# Patient Record
Sex: Male | Born: 1945 | Race: White | Hispanic: No | Marital: Married | Smoking: Never smoker
Health system: Southern US, Community
[De-identification: ages and names within clinical notes are randomized; demographics above are authoritative.]

## PROBLEM LIST (undated history)

## (undated) DIAGNOSIS — R011 Cardiac murmur, unspecified: Secondary | ICD-10-CM

## (undated) DIAGNOSIS — J189 Pneumonia, unspecified organism: Secondary | ICD-10-CM

## (undated) DIAGNOSIS — J849 Interstitial pulmonary disease, unspecified: Secondary | ICD-10-CM

## (undated) DIAGNOSIS — N400 Enlarged prostate without lower urinary tract symptoms: Secondary | ICD-10-CM

## (undated) DIAGNOSIS — M1990 Unspecified osteoarthritis, unspecified site: Secondary | ICD-10-CM

## (undated) DIAGNOSIS — N3010 Interstitial cystitis (chronic) without hematuria: Secondary | ICD-10-CM

## (undated) DIAGNOSIS — D649 Anemia, unspecified: Secondary | ICD-10-CM

## (undated) HISTORY — DX: Interstitial cystitis (chronic) without hematuria: N30.10

## (undated) HISTORY — PX: HERNIA REPAIR: SHX51

---

## 2008-06-26 ENCOUNTER — Ambulatory Visit (HOSPITAL_COMMUNITY): Admission: RE | Admit: 2008-06-26 | Discharge: 2008-06-26 | Payer: Self-pay | Admitting: General Surgery

## 2008-06-29 ENCOUNTER — Encounter: Admission: RE | Admit: 2008-06-29 | Discharge: 2008-06-29 | Payer: Self-pay | Admitting: General Surgery

## 2008-10-02 ENCOUNTER — Ambulatory Visit (HOSPITAL_BASED_OUTPATIENT_CLINIC_OR_DEPARTMENT_OTHER): Admission: RE | Admit: 2008-10-02 | Discharge: 2008-10-02 | Payer: Self-pay | Admitting: Urology

## 2008-10-21 ENCOUNTER — Emergency Department (HOSPITAL_COMMUNITY): Admission: EM | Admit: 2008-10-21 | Discharge: 2008-10-21 | Payer: Self-pay | Admitting: Emergency Medicine

## 2009-01-17 ENCOUNTER — Encounter: Admission: RE | Admit: 2009-01-17 | Discharge: 2009-01-17 | Payer: Self-pay | Admitting: Gastroenterology

## 2010-09-30 LAB — DIFFERENTIAL
Basophils Absolute: 0 10*3/uL (ref 0.0–0.1)
Lymphocytes Relative: 20 % (ref 12–46)
Neutro Abs: 3.6 10*3/uL (ref 1.7–7.7)
Neutrophils Relative %: 71 % (ref 43–77)

## 2010-09-30 LAB — URINALYSIS, ROUTINE W REFLEX MICROSCOPIC
Bilirubin Urine: NEGATIVE
Glucose, UA: NEGATIVE mg/dL
Hgb urine dipstick: NEGATIVE
Protein, ur: NEGATIVE mg/dL

## 2010-09-30 LAB — CBC
HCT: 38.6 % — ABNORMAL LOW (ref 39.0–52.0)
MCV: 88.7 fL (ref 78.0–100.0)
Platelets: 157 10*3/uL (ref 150–400)
RBC: 4.35 MIL/uL (ref 4.22–5.81)
WBC: 5.1 10*3/uL (ref 4.0–10.5)

## 2010-09-30 LAB — COMPREHENSIVE METABOLIC PANEL
BUN: 22 mg/dL (ref 6–23)
CO2: 32 mEq/L (ref 19–32)
Chloride: 100 mEq/L (ref 96–112)
Creatinine, Ser: 1.46 mg/dL (ref 0.4–1.5)
GFR calc non Af Amer: 49 mL/min — ABNORMAL LOW (ref >60)
Total Bilirubin: 0.9 mg/dL (ref 0.3–1.2)

## 2010-10-29 NOTE — Op Note (Signed)
NAMEJOLLY, Javier Phillips            ACCOUNT NO.:  [id number]   MEDICAL RECORD NO.:  [id number]          PATIENT TYPE:  AMB   LOCATION:  DAY                          FACILITY:  [facility]   PHYSICIAN:  Angelia Mould. Derrell Lolling, M.D.DATE OF BIRTH:  Nov 05, 1945   DATE OF PROCEDURE:  06/26/2008  DATE OF DISCHARGE:                               OPERATIVE REPORT   PREOPERATIVE DIAGNOSIS:  Right inguinal hernia.   POSTOPERATIVE DIAGNOSIS:  Right inguinal hernia.   OPERATION PERFORMED:  Laparoscopic, totally extraperitoneal, repair  right inguinal  hernia with 3DMax polypropylene mesh.   SURGEON:  Angelia Mould. Derrell Lolling, M.D.   OPERATIVE INDICATIONS:  This is a 65 year old white man who had a left  inguinal hernia repair several years ago and did well from that.  He  presents now with a 2-year history of a bulge in his right groin.  This  is causing some discomfort and has been getting larger.  On exam he has  a moderate size right inguinal hernia which is reducible.  There is no  evidence of hernia on the left side.  He was counseled as an outpatient.  He is brought to the operating room electively.   OPERATIVE FINDINGS:  The patient had an indirect right inguinal hernia.  I did not see any evidence of femoral hernia or direct hernia.  I was  able to completely reduce the indirect sac well back above the level of  the anterior superior iliac spine.   OPERATIVE TECHNIQUE:  Following the induction of general endotracheal  anesthesia, Foley catheter was inserted.  The bladder was emptied.  The  Foley balloon was deflated and the catheter taped to his leg.  The  abdomen and genitalia were prepped and draped in a sterile fashion.  Intravenous antibiotics were given.  A surgical time-out and surgical  checklist were held, identifying correct patient, correct procedure, and  correct site.  Half percent Marcaine with epinephrine was used as a  local infiltration anesthetic.  A curved transverse incision was  made at  the lower rim of the umbilicus.  The fascia was incised transversely,  exposing the medial border of the right rectus muscle.  A dissector  balloon was inserted behind the right rectus muscle and the right rectus  sheath down to just above the level of the symphysis pubis.  A video  camera was inserted.  The dissector balloon was inflated with air  manually under direct vision and held in place for about 3 minutes.  Visualization was good, demonstrating the rectus muscles anteriorly,  preperitoneal space posteriorly, symphysis pubis and Cooper ligaments on  both sides inferiorly, and we could see the inferior epigastric vessels  on both sides.  After removing the dissector balloon, we secured the  trocar and connected it to the insufflator at 12 mmHg.  Visualization  was good and bleeding was minimal.  We placed a 5-mm trocar in the  midline below the umbilicus and used that to clean off the peritoneum in  the left lower quadrant and then we placed a 5-mm trocar in the left  lower quadrant.  We had a  pneumoperitoneum and so we placed a Veress  needle in the right upper quadrant to evacuate the pneumoperitoneum.  That was done without any difficulty.  We dissected the peritoneum off  of the lateral abdominal wall on the right side and then carried the  dissection back to the cord structures.  We identified the inferior  epigastric vessels and the cord structures.  We identified pulsations of  the iliac vessels.  We spent some time dissecting the indirect hernia  sac and the peritoneum down off of the cord structures.  We identified  the testicular vessels and the vas deferens.  We stripped the peritoneum  far back above the level of the anterior superior iliac spine.  I did  not see any other hernias.  A Bard 3DMax mesh, large size, was inserted  and positioned in the usual fashion.  This overlapped the midline  slightly, overlapped Cooper ligaments slightly and then deployed   laterally, lateral to the cord structures quite nicely.  A 5-mm screw  tacker was placed.  About 4 screw tacks were placed along the superior  rim of the Cooper ligament and symphysis pubis.  Tacks were placed up  the midline and across the posterior belly of the rectus muscle.  Laterally I was careful to palpate the tacker through the abdominal wall  to be sure that all of the fixation tacks were placed above the  iliopubic tract.  The mesh fixation was inspected and looked very good,  covering all areas intended.  There was no bleeding.  The trocars were  removed.  The pneumoperitoneum was released.  The Veress needle was  removed.  The fascia at the umbilicus was closed with 2 figure-of-eight  sutures of 0 Vicryl.  Skin incisions were closed with subcuticular  sutures of 4-0 Monocryl and Dermabond.  Clean bandages were placed and  the patient taken to the recovery room in stable condition.  Estimated  blood loss was about 10 mL.  Complications, none.  Sponge, needle and  instrument counts were correct.      Angelia Mould. Derrell Lolling, M.D.  Electronically Signed     HMI/MEDQ  D:  06/26/2008  T:  06/26/2008  Job:  [id number]   cc:   Chales Salmon. Abigail Miyamoto, M.D.  Fax: [phone]

## 2010-10-29 NOTE — Consult Note (Signed)
Javier Phillips, Javier Phillips NO.:  [id number]   MEDICAL RECORD NO.:  [id number]          PATIENT TYPE:  AMB   LOCATION:  DAY                          FACILITY:  [facility]   PHYSICIAN:  Courtney Paris, M.D.DATE OF BIRTH:  1946/01/19   DATE OF CONSULTATION:  06/27/2008  DATE OF DISCHARGE:                                 CONSULTATION   REQUESTING PHYSICIAN:  Angelia Mould. Derrell Lolling, M.D.   REASON FOR CONSULTATION:  Urinary retention, postoperative laparoscopic  hernia.   HISTORY OF PRESENT ILLNESS:  This 65 year old patient had a laparoscopic  hernia done today.  He voided a small amount before he left outpatient  but when he got home he could not void.  He came back and was in urinary  retention.  The nurses tried to place a Foley catheter but were unable  to do so.  The  patient has been seeing Dr. Isabel Caprice for a couple of  years.  He has been on Flomax monotherapy.  He has never had prostate  surgery or UTI.  He had not been in retention before.  He did have some  balanitis from a phimosis and was originally set up for a circumcision  but then later cancelled when this got better.  He last saw Dr. Isabel Caprice  about six months ago.   PAST MEDICAL HISTORY/SOCIAL HISTORY/REVIEW OF SYSTEMS:  Are all noted on  the chart and unchanged.   FAMILY HISTORY:  Unremarkable.   PHYSICAL EXAMINATION:  GENERAL:  He is a pleasant white male in some  acute distress due to bladder distention.  He is a thin white male.  VITAL SIGNS:  Stable.  HEENT:  Clear.  NECK:  Supple.  ABDOMEN:  Soft.  Recent scars from recent surgery and the bladder is  distended.  GENITOURINARY:  Penis is uncircumcised with the phimosis.  Bilaterally  distended, somewhat atrophic testes.  Prostate is moderate in size, not  fixed or indurated.  SVs not palpated.  EXTREMITIES:  Negative.  No edema of the distal pulses.   With Xylocaine jelly I was able to insert a #16 Foley catheter without  difficulty and he had  nearly 1000 cc of clear urine obtained.  He had  immediate relief.   IMPRESSION:  1. Urinary retention.  2. Postoperative laparoscopic hernia repair.  3. Chronic obstructive uropathy.  4. Phimosis.   RECOMMENDATIONS:  1. Continue Flomax.  2. Send out with Foley catheter in place with a leg bag and a large      bag for night time use.  3. Call Dr. Ellin Goodie office tomorrow to see about when he wants to      come in for a voiding trial but remove the catheter 6-8 hours      before he comes in to the office for followup.  Dr. Isabel Caprice may want      to put him on some finasteride but for now just continue      monotherapy with Flomax.      Courtney Paris, M.D.  Electronically Signed     HMK/MEDQ  D:  06/26/2008  T:  06/26/2008  Job:  [id number]

## 2010-10-29 NOTE — Op Note (Signed)
Javier Phillips, Javier Phillips            ACCOUNT NO.:  [id number]   MEDICAL RECORD NO.:  [id number]          PATIENT TYPE:  AMB   LOCATION:  [facility]                         FACILITY:  [facility]   PHYSICIAN:  Valetta Fuller, M.D.  DATE OF BIRTH:  12-Aug-1945   DATE OF PROCEDURE:  DATE OF DISCHARGE:                               OPERATIVE REPORT   PREOPERATIVE DIAGNOSES:  1. Phimosis.  2. Recurrent balanitis.   POSTOPERATIVE DIAGNOSES:  1. Phimosis.  2. Recurrent balanitis.   PROCEDURE PERFORMED:  Circumcision.   SURGEON:  Valetta Fuller, M.D.   ANESTHESIA:  General with penile block.   INDICATIONS:  Javier Phillips is 65 years of age.  He has had very  longstanding problems with chronic phimosis and recurrent balanitis.  We  had talked to him several times in the past about the possibility of  circumcision.  He has continued to have intermittent problems and called  recently requesting that we go ahead with circumcision.  The patient  appeared to understand the advantages and disadvantages of this  procedure.  He currently has considerable difficulty retracting his  foreskin but does not have any active balanitis at this time.   TECHNIQUE AND FINDINGS:  The patient was brought to the operating room.  Appropriate patient identification was performed.  The patient had  successful induction of general anesthesia.  He was then prepped and  draped in the usual manner.  Operative time-out was performed.  Penile  block was performed with 10 mL of 4% Marcaine.  A circumferential  incision was made behind the coronal sulcus.  The inner aspect of the  foreskin had to be opened with a hemostat to allow for retraction.  The  penis and glans penis were then reprepped since we were unable to  completely retract the foreskin initially.  Once that was accomplished,  a second circumferential incision was made in the mucosal collar.  The  sleeve or redundant foreskin was removed.  Some frenular bands  were  taken down and the frenulum was then reapproximated along the mucosal  collar with some interrupted 4-0 Vicryl suture.  Skin was then  reapproximated with interrupted 4-0 Vicryl suture.  Bacitracin ointment  was placed around the incision.  A Tegaderm dressing was loosely applied  and the penis was lightly wrapped with Coban.  The patient appeared to  tolerate the procedure well.  There did not appear to be any obvious  complications or problems.      Valetta Fuller, M.D.  Electronically Signed    DSG/MEDQ  D:  10/02/2008  T:  10/02/2008  Job:  [id number]

## 2011-05-23 ENCOUNTER — Inpatient Hospital Stay (HOSPITAL_BASED_OUTPATIENT_CLINIC_OR_DEPARTMENT_OTHER)
Admission: EM | Admit: 2011-05-23 | Discharge: 2011-05-26 | DRG: 194 | Disposition: A | Discharge status: Home / Self Care | Payer: Medicare Other | Attending: Internal Medicine | Admitting: Internal Medicine

## 2011-05-23 ENCOUNTER — Other Ambulatory Visit: Payer: Self-pay

## 2011-05-23 DIAGNOSIS — J189 Pneumonia, unspecified organism: Principal | ICD-10-CM | POA: Diagnosis present

## 2011-05-23 DIAGNOSIS — R0600 Dyspnea, unspecified: Secondary | ICD-10-CM | POA: Diagnosis present

## 2011-05-23 DIAGNOSIS — E876 Hypokalemia: Secondary | ICD-10-CM | POA: Diagnosis present

## 2011-05-23 DIAGNOSIS — R0602 Shortness of breath: Secondary | ICD-10-CM | POA: Diagnosis present

## 2011-05-23 DIAGNOSIS — D649 Anemia, unspecified: Secondary | ICD-10-CM | POA: Diagnosis present

## 2011-05-23 DIAGNOSIS — R5381 Other malaise: Secondary | ICD-10-CM | POA: Diagnosis present

## 2011-05-23 DIAGNOSIS — Z23 Encounter for immunization: Secondary | ICD-10-CM

## 2011-05-23 DIAGNOSIS — E860 Dehydration: Secondary | ICD-10-CM | POA: Diagnosis present

## 2011-05-23 DIAGNOSIS — N179 Acute kidney failure, unspecified: Secondary | ICD-10-CM | POA: Diagnosis present

## 2011-05-23 DIAGNOSIS — N400 Enlarged prostate without lower urinary tract symptoms: Secondary | ICD-10-CM | POA: Diagnosis present

## 2011-05-23 DIAGNOSIS — D696 Thrombocytopenia, unspecified: Secondary | ICD-10-CM | POA: Diagnosis present

## 2011-05-23 DIAGNOSIS — E871 Hypo-osmolality and hyponatremia: Secondary | ICD-10-CM | POA: Diagnosis present

## 2011-05-23 DIAGNOSIS — R509 Fever, unspecified: Secondary | ICD-10-CM | POA: Diagnosis present

## 2011-05-23 HISTORY — DX: Benign prostatic hyperplasia without lower urinary tract symptoms: N40.0

## 2011-05-23 HISTORY — DX: Cardiac murmur, unspecified: R01.1

## 2011-05-23 LAB — DIFFERENTIAL
Basophils Absolute: 0 10*3/uL (ref 0.0–0.1)
Basophils Relative: 0 % (ref 0–1)
Lymphocytes Relative: 6 % — ABNORMAL LOW (ref 12–46)
Monocytes Relative: 10 % (ref 3–12)
Neutro Abs: 6.5 10*3/uL (ref 1.7–7.7)

## 2011-05-23 LAB — URINALYSIS, ROUTINE W REFLEX MICROSCOPIC
Bilirubin Urine: NEGATIVE
Hgb urine dipstick: NEGATIVE
Ketones, ur: 15 mg/dL — AB
Protein, ur: 30 mg/dL — AB
Urobilinogen, UA: 0.2 mg/dL (ref 0.0–1.0)

## 2011-05-23 LAB — CBC
HCT: 37.3 % — ABNORMAL LOW (ref 39.0–52.0)
Hemoglobin: 12.8 g/dL — ABNORMAL LOW (ref 13.0–17.0)
RDW: 14.1 % (ref 11.5–15.5)
WBC: 7.8 10*3/uL (ref 4.0–10.5)

## 2011-05-23 LAB — COMPREHENSIVE METABOLIC PANEL
AST: 20 U/L (ref 0–37)
Alkaline Phosphatase: 55 U/L (ref 39–117)
CO2: 25 mEq/L (ref 19–32)
Chloride: 95 mEq/L — ABNORMAL LOW (ref 96–112)
Creatinine, Ser: 1.3 mg/dL (ref 0.50–1.35)
GFR calc non Af Amer: 56 mL/min — ABNORMAL LOW (ref >90)
Total Bilirubin: 0.7 mg/dL (ref 0.3–1.2)

## 2011-05-23 LAB — URINE MICROSCOPIC-ADD ON

## 2011-05-23 LAB — PROCALCITONIN: Procalcitonin: 0.45 ng/mL

## 2011-05-23 LAB — LACTIC ACID, PLASMA: Lactic Acid, Venous: 1.1 mmol/L (ref 0.5–2.2)

## 2011-05-23 MED ORDER — ACETAMINOPHEN 325 MG PO TABS
650.0000 mg | ORAL_TABLET | Freq: Four times a day (QID) | ORAL | Status: DC | PRN
  Administered 2011-05-24 (×2): 650 mg via ORAL
  Filled 2011-05-23 (×2): qty 2

## 2011-05-23 MED ORDER — DEXTROSE 5 % IV SOLN
1.0000 g | Freq: Once | INTRAVENOUS | Status: AC
  Administered 2011-05-23: 1 g via INTRAVENOUS
  Filled 2011-05-23: qty 10

## 2011-05-23 MED ORDER — OXYCODONE HCL 5 MG PO TABS: 5.0000 mg | ORAL_TABLET | ORAL | Status: DC | PRN

## 2011-05-23 MED ORDER — DEXTROSE 5 % IV SOLN
500.0000 mg | INTRAVENOUS | Status: DC
  Administered 2011-05-24 – 2011-05-25 (×2): 500 mg via INTRAVENOUS
  Filled 2011-05-23 (×3): qty 500

## 2011-05-23 MED ORDER — ONDANSETRON HCL 4 MG PO TABS: 4.0000 mg | ORAL_TABLET | Freq: Four times a day (QID) | ORAL | Status: DC | PRN

## 2011-05-23 MED ORDER — HYDROMORPHONE HCL PF 1 MG/ML IJ SOLN: 0.5000 mg | INTRAMUSCULAR | Status: DC | PRN

## 2011-05-23 MED ORDER — ONDANSETRON HCL 4 MG/2ML IJ SOLN: 4.0000 mg | Freq: Four times a day (QID) | INTRAMUSCULAR | Status: DC | PRN

## 2011-05-23 MED ORDER — ALBUTEROL SULFATE (5 MG/ML) 0.5% IN NEBU
2.5000 mg | INHALATION_SOLUTION | Freq: Four times a day (QID) | RESPIRATORY_TRACT | Status: DC
  Administered 2011-05-24: 2.5 mg via RESPIRATORY_TRACT
  Filled 2011-05-23: qty 0.5

## 2011-05-23 MED ORDER — ZOLPIDEM TARTRATE 5 MG PO TABS: 5.0000 mg | ORAL_TABLET | Freq: Every evening | ORAL | Status: DC | PRN

## 2011-05-23 MED ORDER — ENOXAPARIN SODIUM 40 MG/0.4ML ~~LOC~~ SOLN
40.0000 mg | SUBCUTANEOUS | Status: DC
  Administered 2011-05-24: 40 mg via SUBCUTANEOUS
  Filled 2011-05-23: qty 0.4

## 2011-05-23 MED ORDER — ALUM & MAG HYDROXIDE-SIMETH 200-200-20 MG/5ML PO SUSP: 30.0000 mL | Freq: Four times a day (QID) | ORAL | Status: DC | PRN

## 2011-05-23 MED ORDER — SODIUM CHLORIDE 0.9 % IV SOLN
INTRAVENOUS | Status: DC
  Administered 2011-05-24 – 2011-05-25 (×4): via INTRAVENOUS

## 2011-05-23 MED ORDER — DEXTROSE 5 % IV SOLN
500.0000 mg | Freq: Once | INTRAVENOUS | Status: DC
  Filled 2011-05-23: qty 500

## 2011-05-23 MED ORDER — DEXTROSE 5 % IV SOLN
1.0000 g | INTRAVENOUS | Status: DC
  Administered 2011-05-24 – 2011-05-25 (×2): 1 g via INTRAVENOUS
  Filled 2011-05-23 (×3): qty 10

## 2011-05-23 MED ORDER — ACETAMINOPHEN 650 MG RE SUPP: 650.0000 mg | Freq: Four times a day (QID) | RECTAL | Status: DC | PRN

## 2011-05-23 MED ORDER — ACETAMINOPHEN 650 MG RE SUPP
650.0000 mg | Freq: Once | RECTAL | Status: AC
  Administered 2011-05-23: 650 mg via RECTAL
  Filled 2011-05-23: qty 1

## 2011-05-23 MED ORDER — ENOXAPARIN SODIUM 60 MG/0.6ML ~~LOC~~ SOLN: 1.0000 mg/kg | Freq: Two times a day (BID) | SUBCUTANEOUS | Status: DC

## 2011-05-23 MED ORDER — SODIUM CHLORIDE 0.9 % IV SOLN: INTRAVENOUS | Status: DC

## 2011-05-23 MED ORDER — ACETAMINOPHEN 325 MG PO TABS: 650.0000 mg | ORAL_TABLET | Freq: Four times a day (QID) | ORAL | Status: DC | PRN

## 2011-05-23 MED ORDER — ACETAMINOPHEN 325 MG PO TABS
650.0000 mg | ORAL_TABLET | Freq: Once | ORAL | Status: AC
  Administered 2011-05-23: 650 mg via ORAL
  Filled 2011-05-23: qty 2

## 2011-05-23 MED ORDER — ONDANSETRON HCL 4 MG/2ML IJ SOLN
4.0000 mg | Freq: Once | INTRAMUSCULAR | Status: AC
  Administered 2011-05-23: 4 mg via INTRAVENOUS

## 2011-05-23 MED ORDER — ACETAMINOPHEN 325 MG PO TABS
650.0000 mg | ORAL_TABLET | Freq: Once | ORAL | Status: DC
  Filled 2011-05-23: qty 2

## 2011-05-23 MED ORDER — ONDANSETRON HCL 4 MG/2ML IJ SOLN
INTRAMUSCULAR | Status: AC
  Administered 2011-05-23: 4 mg via INTRAVENOUS
  Filled 2011-05-23: qty 2

## 2011-05-23 NOTE — ED Provider Notes (Signed)
History     CSN: [id number] Arrival date & time: 05/23/2011  1:26 PM   First MD Initiated Contact with Patient 05/23/11 1326      Chief Complaint  Patient presents with  . Sinusitis  . Sore Throat  . Cough  . Dehydration  . Fever    (Consider location/radiation/quality/duration/timing/severity/associated sxs/prior treatment) HPI Comments: Patient is a 65 year old man who says that his illness started like sinusitis several days ago. He had been seen by Juluis Rainier M.D., 2 days ago, and was treated for sinusitis with amoxicillin. He has gotten progressively worse, with high fever, 102.9 this morning, a dry hacking cough, and low blood pressure of approximately 90/50. Dr. Zachery Dauer was concerned that he could be developing pneumonia, and he was sent to [facility] ED for further evaluation and treatment.  Patient is a 65 y.o. male presenting with sinusitis, pharyngitis, cough, and fever. The history is provided by the patient and a caregiver. No language interpreter was used.  Sinusitis  This is a new problem. The current episode started more than 2 days ago. The problem has been gradually worsening. The maximum temperature recorded prior to his arrival was 102 to 102.9 F. The pain is at a severity of 7/10. The pain is moderate. The pain has been constant since onset. Associated symptoms include chills, sweats, congestion, sore throat, cough and shortness of breath. Treatments tried: He was treated with amoxicillin for the past 2 days without benefit.  Sore Throat Associated symptoms include shortness of breath.  Cough Associated symptoms include chills, sweats, sore throat and shortness of breath.  Fever Primary symptoms of the febrile illness include fever, cough and shortness of breath.    Past Medical History  Diagnosis Date  . Enlarged prostate     Past Surgical History  Procedure Date  . Hernia repair     History reviewed. No pertinent family  history.  History  Substance Use Topics  . Smoking status: Never Smoker   . Smokeless tobacco: Never Used  . Alcohol Use: No      Review of Systems  Constitutional: Positive for fever and chills.  HENT: Positive for congestion and sore throat.   Eyes: Negative.   Respiratory: Positive for cough and shortness of breath.   Cardiovascular: Negative.   Gastrointestinal: Negative.   Genitourinary: Negative.   Musculoskeletal: Negative.   Neurological: Negative.   Psychiatric/Behavioral: Negative.     Allergies  Review of patient's allergies indicates no known allergies.  Home Medications   Current Outpatient Rx  Name Route Sig Dispense Refill  . ACETAMINOPHEN 325 MG PO TABS Oral Take 650 mg by mouth every 6 (six) hours as needed. For fever     . AMOXICILLIN 875 MG PO TABS Oral Take 875 mg by mouth 2 (two) times daily.     Marland Kitchen TAMSULOSIN HCL 0.4 MG PO CAPS Oral Take 0.4 mg by mouth daily.       BP 93/61  Pulse 84  Temp(Src) 100 F (37.8 C) (Oral)  Resp 16  Ht 5\' 7"  (1.702 m)  Wt 134 lb (60.782 kg)  BMI 20.99 kg/m2  SpO2 97%  Physical Exam  Nursing note and vitals reviewed. Constitutional: He is oriented to person, place, and time. He appears well-developed.       Patient is a slender elderly man who appears ill.  HENT:  Head: Normocephalic and atraumatic.  Right Ear: External ear normal.  Left Ear: External ear normal.  Mouth/Throat: Oropharynx is clear  and moist.  Eyes: Conjunctivae are normal.  Neck: Normal range of motion. Neck supple.  Cardiovascular: Normal rate and regular rhythm.   Pulmonary/Chest: Effort normal and breath sounds normal. He has no wheezes. He has no rales.  Abdominal: Soft. Bowel sounds are normal. He exhibits no distension. There is no tenderness.  Musculoskeletal: Normal range of motion.  Neurological: He is alert and oriented to person, place, and time.       No sensory or motor deficit.  Skin: Skin is warm and dry.  Psychiatric: He  has a normal mood and affect. His behavior is normal.    ED Course  Procedures (including critical care time)   Labs Reviewed  CBC  DIFFERENTIAL  COMPREHENSIVE METABOLIC PANEL  URINALYSIS, ROUTINE W REFLEX MICROSCOPIC  URINE CULTURE  LACTIC ACID, PLASMA  PROCALCITONIN  CULTURE, BLOOD (ROUTINE X 2)  CULTURE, BLOOD (ROUTINE X 2)   2:11 PM Patient was seen and had physical exam. I contacted Juluis Rainier M.D. and discussed his situation. She is concerned that he could have pneumonia. She also felt he was dehydrated. I called [facility] radiology to have him read the patient's chest x-ray. Laboratory tests and IV fluids were ordered.  2:55 PM  Date: 05/23/2011  Rate:78  Rhythm: normal sinus rhythm  QRS Axis: normal  Intervals: normal  ST/T Wave abnormalities: normal  Conduction Disutrbances:none  Narrative Interpretation: Normal EKG.  Old EKG Reviewed: unchanged  Results for orders placed during the hospital encounter of 05/23/11  CBC      Component Value Range   WBC 7.8  4.0 - 10.5 (K/uL)   RBC 4.42  4.22 - 5.81 (MIL/uL)   Hemoglobin 12.8 (*) 13.0 - 17.0 (g/dL)   HCT 16.1 (*) 09.6 - 52.0 (%)   MCV 84.4  78.0 - 100.0 (fL)   MCH 29.0  26.0 - 34.0 (pg)   MCHC 34.3  30.0 - 36.0 (g/dL)   RDW 04.5  40.9 - 81.1 (%)   Platelets 94 (*) 150 - 400 (K/uL)  DIFFERENTIAL      Component Value Range   Neutrophils Relative 84 (*) 43 - 77 (%)   Lymphocytes Relative 6 (*) 12 - 46 (%)   Monocytes Relative 10  3 - 12 (%)   Eosinophils Relative 0  0 - 5 (%)   Basophils Relative 0  0 - 1 (%)   Neutro Abs 6.5  1.7 - 7.7 (K/uL)   Lymphs Abs 0.5 (*) 0.7 - 4.0 (K/uL)   Monocytes Absolute 0.8  0.1 - 1.0 (K/uL)   Eosinophils Absolute 0.0  0.0 - 0.7 (K/uL)   Basophils Absolute 0.0  0.0 - 0.1 (K/uL)  COMPREHENSIVE METABOLIC PANEL      Component Value Range   Sodium 131 (*) 135 - 145 (mEq/L)   Potassium 3.5  3.5 - 5.1 (mEq/L)   Chloride 95 (*) 96 - 112 (mEq/L)   CO2 25  19 - 32 (mEq/L)    Glucose, Bld 114 (*) 70 - 99 (mg/dL)   BUN 19  6 - 23 (mg/dL)   Creatinine, Ser 9.14  0.50 - 1.35 (mg/dL)   Calcium 8.5  8.4 - 78.2 (mg/dL)   Total Protein 6.9  6.0 - 8.3 (g/dL)   Albumin 3.4 (*) 3.5 - 5.2 (g/dL)   AST 20  0 - 37 (U/L)   ALT 14  0 - 53 (U/L)   Alkaline Phosphatase 55  39 - 117 (U/L)   Total Bilirubin 0.7  0.3 -  1.2 (mg/dL)   GFR calc non Af Amer 56 (*) >90 (mL/min)   GFR calc Af Amer 65 (*) >90 (mL/min)  LACTIC ACID, PLASMA      Component Value Range   Lactic Acid, Venous 1.1  0.5 - 2.2 (mmol/L)   4:07 PM Laboratory tests were reviewed. He has a CBC with white blood count of 7800 with 84% neutrophils. Chemistries showed sodium slightly low at 131 glucose slightly elevated at 114. Lactic acid was normal at 1.1. Patient has been unable to urinate, showing to some extent how dehydrated he has. Chest x-ray done at [facility] showed a right lower lobe pneumonia. I advised the patient and his wife of these findings. We will treat him for community-acquired pneumonia with Rocephin and Zithromax.  4:49 PM Case discussed with Dr. Venetia Constable, who accepts pt in transfer to [facility].   1. Community acquired pneumonia            Carleene Cooper III, MD 05/23/11 [phone]

## 2011-05-23 NOTE — ED Notes (Signed)
Waiting on urine from Pt. And he has been given water to drink for UA

## 2011-05-23 NOTE — ED Notes (Signed)
Pt reports cold symptoms x 5 days, treated with Amoxicillin and fever developed 2 days ago.  Sent to ED by PMD for further evaluation.

## 2011-05-23 NOTE — ED Notes (Signed)
Encouraging Pt. To urinate

## 2011-05-23 NOTE — ED Notes (Signed)
Report called to Taryn RN  On floor 3000 at [facility]

## 2011-05-23 NOTE — H&P (Addendum)
DATE OF ADMISSION:  05/24/2011  PCP:   Dr. Juluis Rainier   Chief Complaint:   SOB, Cough and Weakness    HPI:    Javier Phillips is an 65 y.o. male who was sent to the [facility] due to symptoms of  cough, sore throat, weakness and worsening SOB that started 1 week ago after his PCP  Dr. Juluis Rainier ordered a Chest X-Ray which revealed a RUL pneumonia today.  He has had fevers and chills and chest congestion and reports coughing up yellowish sputum.  He has had a decreased appetite over the past week as well.  He denies having myalgias, and also denies having hemoptysis, or nausea or vomiting or diarrhea.       Past Medical History  Diagnosis Date  . Enlarged prostate   . Heart murmur     nonsymptomatic    Past Surgical History  Procedure Date  . Hernia repair              Bilateral Hernia Repairs  Medications:  HOME MEDS: Prior to Admission medications   Medication Sig Start Date End Date Taking? Authorizing Provider  acetaminophen (TYLENOL) 325 MG tablet Take 650 mg by mouth every 6 (six) hours as needed. For fever    Yes Historical Provider, MD  amoxicillin (AMOXIL) 875 MG tablet Take 875 mg by mouth 2 (two) times daily.    Yes Historical Provider, MD  Tamsulosin HCl (FLOMAX) 0.4 MG CAPS Take 0.4 mg by mouth daily.    Yes Historical Provider, MD    Allergies:  No Known Allergies  Social History:   reports that he has never smoked. He has never used smokeless tobacco. He reports that he does not drink alcohol or use illicit drugs.  Family History: Family History  Problem Relation Age of Onset  . Stroke Mother   . Dementia Father   . Dementia Brother     Review of Systems:  The patient denies vision loss, decreased hearing, hoarseness, chest pain, syncope, peripheral edema, balance deficits, hemoptysis, abdominal pain, melena, hematochezia, severe indigestion/heartburn, hematuria, incontinence, genital sores, suspicious  skin lesions, transient blindness, difficulty walking, depression, unusual weight change, abnormal bleeding, enlarged lymph nodes, angioedema.    Physical Exam:    GEN:  Pleasant 66 year old thin but well developed Caucasian male examined  and in no acute distress; cooperative with exam Filed Vitals:   05/23/11 1527 05/23/11 1835 05/23/11 1932 05/23/11 2041  BP:  94/60  114/50  Pulse: 73 76 78 91  Temp: 98.9 F (37.2 C) 99.2 F (37.3 C) 99.5 F (37.5 C) 103 F (39.4 C)  TempSrc: Oral Oral Oral Oral  Resp:  18  16  Height:      Weight:      SpO2:  95% 100% 94%   Blood pressure 114/50, pulse 91, temperature 103 F (39.4 C), temperature source Oral, resp. rate 16, height 5\' 7"  (1.702 m), weight 60.782 kg (134 lb), SpO2 94.00%. PSYCH: He is alert and oriented x4; does not appear anxious does not appear depressed; affect is normal HEENT: Normocephalic and Atraumatic, Mucous membranes pink; PERRLA; EOM intact; Fundi:  Benign;  No scleral icterus, Nares: Patent, Oropharynx: Clear, Fair Dentition, Neck:  FROM, no cervical lymphadenopathy nor thyromegaly or carotid bruit; no JVD; Breasts:: Not examined CHEST WALL: No tenderness CHEST: Normal respiration, clear to auscultation bilaterally HEART: Regular rate and rhythm; no murmurs rubs or gallops BACK: No kyphosis or scoliosis; no  CVA tenderness ABDOMEN: Positive Bowel Sounds, soft non-tender; no masses, no organomegaly. Rectal Exam: Not done EXTREMITIES: No bone or joint deformity; age-appropriate arthropathy of the hands and knees; no cyanosis, clubbing or edema; no ulcerations. Genitalia: not examined PULSES: 2+ and symmetric SKIN: Normal hydration no rash or ulceration CNS: Cranial nerves 2-12 grossly intact no focal neurologic deficit   Labs & Imaging Results for orders placed during the hospital encounter of 05/23/11 (from the past 48 hour(s))  CBC     Status: Abnormal   Collection Time   05/23/11  2:25 PM      Component Value  Range Comment   WBC 7.8  4.0 - 10.5 (K/uL)    RBC 4.42  4.22 - 5.81 (MIL/uL)    Hemoglobin 12.8 (*) 13.0 - 17.0 (g/dL)    HCT 40.9 (*) 81.1 - 52.0 (%)    MCV 84.4  78.0 - 100.0 (fL)    MCH 29.0  26.0 - 34.0 (pg)    MCHC 34.3  30.0 - 36.0 (g/dL)    RDW 91.4  78.2 - 95.6 (%)    Platelets 94 (*) 150 - 400 (K/uL)   DIFFERENTIAL     Status: Abnormal   Collection Time   05/23/11  2:25 PM      Component Value Range Comment   Neutrophils Relative 84 (*) 43 - 77 (%)    Lymphocytes Relative 6 (*) 12 - 46 (%)    Monocytes Relative 10  3 - 12 (%)    Eosinophils Relative 0  0 - 5 (%)    Basophils Relative 0  0 - 1 (%)    Neutro Abs 6.5  1.7 - 7.7 (K/uL)    Lymphs Abs 0.5 (*) 0.7 - 4.0 (K/uL)    Monocytes Absolute 0.8  0.1 - 1.0 (K/uL)    Eosinophils Absolute 0.0  0.0 - 0.7 (K/uL)    Basophils Absolute 0.0  0.0 - 0.1 (K/uL)   COMPREHENSIVE METABOLIC PANEL     Status: Abnormal   Collection Time   05/23/11  2:25 PM      Component Value Range Comment   Sodium 131 (*) 135 - 145 (mEq/L)    Potassium 3.5  3.5 - 5.1 (mEq/L)    Chloride 95 (*) 96 - 112 (mEq/L)    CO2 25  19 - 32 (mEq/L)    Glucose, Bld 114 (*) 70 - 99 (mg/dL)    BUN 19  6 - 23 (mg/dL)    Creatinine, Ser 2.13  0.50 - 1.35 (mg/dL)    Calcium 8.5  8.4 - 10.5 (mg/dL)    Total Protein 6.9  6.0 - 8.3 (g/dL)    Albumin 3.4 (*) 3.5 - 5.2 (g/dL)    AST 20  0 - 37 (U/L)    ALT 14  0 - 53 (U/L)    Alkaline Phosphatase 55  39 - 117 (U/L)    Total Bilirubin 0.7  0.3 - 1.2 (mg/dL)    GFR calc non Af Amer 56 (*) >90 (mL/min)    GFR calc Af Amer 65 (*) >90 (mL/min)   LACTIC ACID, PLASMA     Status: Normal   Collection Time   05/23/11  2:25 PM      Component Value Range Comment   Lactic Acid, Venous 1.1  0.5 - 2.2 (mmol/L)   PROCALCITONIN     Status: Normal   Collection Time   05/23/11  2:25 PM      Component Value Range Comment  Procalcitonin 0.45     URINALYSIS, ROUTINE W REFLEX MICROSCOPIC     Status: Abnormal   Collection Time    05/23/11  4:06 PM      Component Value Range Comment   Color, Urine YELLOW  YELLOW     APPearance CLEAR  CLEAR     Specific Gravity, Urine 1.016  1.005 - 1.030     pH 6.0  5.0 - 8.0     Glucose, UA NEGATIVE  NEGATIVE (mg/dL)    Hgb urine dipstick NEGATIVE  NEGATIVE     Bilirubin Urine NEGATIVE  NEGATIVE     Ketones, ur 15 (*) NEGATIVE (mg/dL)    Protein, ur 30 (*) NEGATIVE (mg/dL)    Urobilinogen, UA 0.2  0.0 - 1.0 (mg/dL)    Nitrite NEGATIVE  NEGATIVE     Leukocytes, UA NEGATIVE  NEGATIVE    URINE MICROSCOPIC-ADD ON     Status: Normal   Collection Time   05/23/11  4:06 PM      Component Value Range Comment   Squamous Epithelial / LPF RARE  RARE     Bacteria, UA RARE  RARE     No results found.    Assessment/Plan: 1.  Community Acquired Pneumonia - IV Rocephin and Iv Azithromycin, Nebs, O2.  Chest X-Ray Results reported.   2.  Mild Anemia- monitor   3. Thrombocytopenia- maybe due to lab error, monitor, SCDs for DVT prophylaxis.   4.  BPH stable on meds.   5.  Other plans as per orders.    CODE STATUS:      FULL CODE        Kayd Launer C 05/23/2011, 11:43 PM Addendum: 05/24/2011 @ 0015 AM.

## 2011-05-23 NOTE — ED Notes (Signed)
Pt. Refused any kind of rectal supp for temp.

## 2011-05-23 NOTE — ED Notes (Signed)
Attempted to transfer Pt. In computer and unable due to times if transfer.

## 2011-05-24 DIAGNOSIS — R0602 Shortness of breath: Secondary | ICD-10-CM | POA: Diagnosis present

## 2011-05-24 DIAGNOSIS — E871 Hypo-osmolality and hyponatremia: Secondary | ICD-10-CM | POA: Diagnosis present

## 2011-05-24 DIAGNOSIS — D696 Thrombocytopenia, unspecified: Secondary | ICD-10-CM | POA: Diagnosis present

## 2011-05-24 DIAGNOSIS — R0609 Other forms of dyspnea: Secondary | ICD-10-CM | POA: Diagnosis present

## 2011-05-24 DIAGNOSIS — R5381 Other malaise: Secondary | ICD-10-CM | POA: Diagnosis present

## 2011-05-24 DIAGNOSIS — E876 Hypokalemia: Secondary | ICD-10-CM | POA: Diagnosis not present

## 2011-05-24 DIAGNOSIS — J189 Pneumonia, unspecified organism: Secondary | ICD-10-CM | POA: Diagnosis present

## 2011-05-24 DIAGNOSIS — E860 Dehydration: Secondary | ICD-10-CM | POA: Diagnosis present

## 2011-05-24 DIAGNOSIS — N179 Acute kidney failure, unspecified: Secondary | ICD-10-CM | POA: Diagnosis present

## 2011-05-24 DIAGNOSIS — R0600 Dyspnea, unspecified: Secondary | ICD-10-CM | POA: Diagnosis present

## 2011-05-24 DIAGNOSIS — R509 Fever, unspecified: Secondary | ICD-10-CM | POA: Diagnosis present

## 2011-05-24 LAB — BASIC METABOLIC PANEL
BUN: 13 mg/dL (ref 6–23)
Calcium: 7.9 mg/dL — ABNORMAL LOW (ref 8.4–10.5)
Chloride: 103 mEq/L (ref 96–112)
Creatinine, Ser: 1.25 mg/dL (ref 0.50–1.35)
GFR calc Af Amer: 68 mL/min — ABNORMAL LOW (ref >90)
GFR calc non Af Amer: 59 mL/min — ABNORMAL LOW (ref >90)

## 2011-05-24 LAB — STREP PNEUMONIAE URINARY ANTIGEN: Strep Pneumo Urinary Antigen: NEGATIVE

## 2011-05-24 LAB — CREATININE, SERUM
GFR calc Af Amer: 72 mL/min — ABNORMAL LOW (ref >90)
GFR calc non Af Amer: 62 mL/min — ABNORMAL LOW (ref >90)

## 2011-05-24 LAB — LACTATE DEHYDROGENASE: LDH: 213 U/L (ref 94–250)

## 2011-05-24 LAB — CBC
HCT: 34.8 % — ABNORMAL LOW (ref 39.0–52.0)
MCHC: 33.6 g/dL (ref 30.0–36.0)
Platelets: 89 10*3/uL — ABNORMAL LOW (ref 150–400)
RDW: 14.5 % (ref 11.5–15.5)
WBC: 7.2 10*3/uL (ref 4.0–10.5)

## 2011-05-24 MED ORDER — POTASSIUM CHLORIDE CRYS ER 20 MEQ PO TBCR
40.0000 meq | EXTENDED_RELEASE_TABLET | ORAL | Status: AC
  Administered 2011-05-24 (×2): 40 meq via ORAL
  Filled 2011-05-24 (×2): qty 2

## 2011-05-24 MED ORDER — HYDROCOD POLST-CHLORPHEN POLST 10-8 MG/5ML PO LQCR
5.0000 mL | Freq: Two times a day (BID) | ORAL | Status: DC | PRN
  Administered 2011-05-24 (×2): 5 mL via ORAL
  Filled 2011-05-24 (×4): qty 5

## 2011-05-24 MED ORDER — ALBUTEROL SULFATE (5 MG/ML) 0.5% IN NEBU
2.5000 mg | INHALATION_SOLUTION | Freq: Three times a day (TID) | RESPIRATORY_TRACT | Status: DC
  Administered 2011-05-24 – 2011-05-26 (×6): 2.5 mg via RESPIRATORY_TRACT
  Filled 2011-05-24 (×6): qty 0.5

## 2011-05-24 MED ORDER — ALBUTEROL SULFATE (5 MG/ML) 0.5% IN NEBU
2.5000 mg | INHALATION_SOLUTION | RESPIRATORY_TRACT | Status: DC | PRN
  Filled 2011-05-24: qty 0.5

## 2011-05-24 NOTE — Progress Notes (Signed)
Physical Therapy Evaluation Patient Details Name: Javier Phillips MRN: [id number] DOB: 06-12-1946 Today's Date: 05/24/2011  Problem List:  Patient Active Problem List  Diagnoses  . Pneumonia due to infectious agent  . Shortness of breath  . Fever  . AKI (acute kidney injury)  . Dehydration  . Hypokalemia  . Hyponatremia  . Debility  . Thrombocytopenia    Past Medical History:  Past Medical History  Diagnosis Date  . Enlarged prostate   . Heart murmur     nonsymptomatic   Past Surgical History:  Past Surgical History  Procedure Date  . Hernia repair     PT Assessment/Plan/Recommendation PT Assessment Clinical Impression Statement: pt adm with CAP and is weak from no food and inactivity for approx 5 days.  He should make good recovery and though will be seen on acute, will not need f/u PT PT Recommendation/Assessment: Patient will need skilled PT in the acute care venue PT Problem List: Decreased strength;Decreased activity tolerance;Decreased balance;Cardiopulmonary status limiting activity Barriers to Discharge: Other (comment) (wife is coming down with a cold presently) PT Therapy Diagnosis : Generalized weakness PT Plan PT Frequency: Min 3X/week PT Treatment/Interventions: Gait training;Functional mobility training;Therapeutic activities;Balance training;Patient/family education PT Recommendation Follow Up Recommendations: None Equipment Recommended: None recommended by PT PT Goals  Acute Rehab PT Goals PT Goal Formulation: With patient Time For Goal Achievement: 5 days Pt will go Sit to Stand: Independently PT Goal: Sit to Stand - Progress: Other (comment) Pt will Transfer Bed to Chair/Chair to Bed: Independently PT Transfer Goal: Bed to Chair/Chair to Bed - Progress: Other (comment) Pt will Ambulate: >150 feet;Independently PT Goal: Ambulate - Progress: Other (comment) Pt will Go Up / Down Stairs: 3-5 stairs;with modified independence;with rail(s) PT  Goal: Up/Down Stairs - Progress: Other (comment)  PT Evaluation Precautions/Restrictions    Prior Functioning  Home Living Lives With: Spouse Type of Home: House Home Layout: One level Home Access: Stairs to enter [employer] of Steps: 2 Bathroom Shower/Tub: Engineer, manufacturing systems: Standard Home Adaptive Equipment: None Prior Function Level of Independence: Independent with transfers;Independent with gait;Independent with homemaking with wheelchair;Independent with homemaking with ambulation;Independent with basic ADLs Able to Take Stairs?: Yes Driving: Yes Vocation: Retired Financial risk analyst Arousal/Alertness: Awake/alert Overall Cognitive Status: Appears within functional limits for tasks assessed Sensation/Coordination Coordination Gross Motor Movements are Fluid and Coordinated: Yes Fine Motor Movements are Fluid and Coordinated: Yes Extremity Assessment RUE Assessment RUE Assessment: Within Functional Limits LUE Assessment LUE Assessment: Within Functional Limits RLE Assessment RLE Assessment: Within Functional Limits LLE Assessment LLE Assessment: Within Functional Limits Mobility (including Balance) Bed Mobility Bed Mobility: Yes Supine to Sit: 7: Independent Transfers Transfers: Yes Sit to Stand: 5: Supervision;From bed Stand to Sit: 5: Supervision;To bed;To chair/3-in-1 Ambulation/Gait Ambulation/Gait: Yes Ambulation/Gait Assistance: 5: Supervision Ambulation/Gait Assistance Details (indicate cue type and reason): mildly unsteady, but safe Ambulation Distance (Feet): 500 Feet Assistive device: None Gait Pattern: Within Functional Limits Stairs: No  Posture/Postural Control Posture/Postural Control: No significant limitations Balance Balance Assessed: No Exercise    End of Session PT - End of Session Activity Tolerance: Patient tolerated treatment well;Patient limited by fatigue Patient left: in chair Nurse Communication:  Mobility status for ambulation General Behavior During Session: [facility] for tasks performed Cognition: [facility] for tasks performed  Jeaneane Adamec, Eliseo Gum 05/24/2011, 4:55 PM  05/24/2011  Rosebud Bing, PT [phone] (pager)

## 2011-05-24 NOTE — Progress Notes (Signed)
Subjective: Still febrile, with mild shortness of breath symptoms; no chest pain, no abdominal pain, no nausea or vomiting. Patient reports increase in his appetite.  Objective: Vital signs in last 24 hours: Temp:  [98.9 F (37.2 C)-103 F (39.4 C)] 100.2 F (37.9 C) (12/08 0516) Pulse Rate:  [73-91] 80  (12/08 0516) Resp:  [16-22] 20  (12/08 0516) BP: (94-119)/(50-66) 100/66 mmHg (12/08 0516) SpO2:  [94 %-100 %] 94 % (12/08 0916) FiO2 (%):  [2 %] 2 % (12/08 0516) Weight:  [60.78 kg (133 lb 15.9 oz)] 133 lb 15.9 oz (60.78 kg) (12/07 2238) Weight change:     Intake/Output from previous day: 12/07 0701 - 12/08 0700 In: 610 [I.V.:610] Out: -  Total I/O In: 120 [P.O.:120] Out: 200 [Urine:200]   Physical Exam: General: Frail looking, Alert, awake, oriented x3, in no acute distress. HEENT: No bruits, no goiter. Heart: Regular rate and rhythm, positive systolic murmurs; no rubs or gallops. Lungs: Diffuse rhonchi, no wheezing and no crackles. Abdomen: Soft, nontender, nondistended, positive bowel sounds. Extremities: No clubbing cyanosis or edema with positive pedal pulses. Neuro: Grossly intact, nonfocal deficits. Muscle strength 4/5 bilaterally symmetrically secondary to poor effort.  Lab Results: Basic Metabolic Panel:  Basename 05/24/11 0500 05/24/11 0015 05/23/11 1425  NA 136 -- 131*  K 3.4* -- 3.5  CL 103 -- 95*  CO2 26 -- 25  GLUCOSE 97 -- 114*  BUN 13 -- 19  CREATININE 1.25 1.20 --  CALCIUM 7.9* -- 8.5  MG -- -- --  PHOS -- -- --   Liver Function Tests:  Basename 05/23/11 1425  AST 20  ALT 14  ALKPHOS 55  BILITOT 0.7  PROT 6.9  ALBUMIN 3.4*   CBC:  Basename 05/24/11 0500 05/23/11 1425  WBC 7.2 7.8  NEUTROABS -- 6.5  HGB 11.7* 12.8*  HCT 34.8* 37.3*  MCV 86.6 84.4  PLT 89* 94*   Misc. Labs:  Recent Results (from the past 240 hour(s))  CULTURE, BLOOD (ROUTINE X 2)     Status: Normal (Preliminary result)   Collection Time   05/23/11  2:25 PM     Component Value Range Status Comment   Specimen Description BLOOD RT [facility]   Final    Special Requests NONE BOTTLES DRAWN AEROBIC AND ANAEROBIC   Final    Setup Time [id number]   Final    Culture     Final    Value:        BLOOD CULTURE RECEIVED NO GROWTH TO DATE CULTURE WILL BE HELD FOR 5 DAYS BEFORE ISSUING A FINAL NEGATIVE REPORT   Report Status PENDING   Incomplete   CULTURE, BLOOD (ROUTINE X 2)     Status: Normal (Preliminary result)   Collection Time   05/23/11  2:35 PM      Component Value Range Status Comment   Specimen Description BLOOD RT [facility]   Final    Special Requests NONE BOTTLES DRAWN AEROBIC AND ANAEROBIC   Final    Setup Time [id number]   Final    Culture     Final    Value:        BLOOD CULTURE RECEIVED NO GROWTH TO DATE CULTURE WILL BE HELD FOR 5 DAYS BEFORE ISSUING A FINAL NEGATIVE REPORT   Report Status PENDING   Incomplete     Studies/Results: No results found.  Medications: Scheduled Meds:   . acetaminophen  650 mg Rectal Once  . acetaminophen  650 mg Oral Once  .  albuterol  2.5 mg Nebulization TID  . azithromycin  500 mg Intravenous Once  . azithromycin  500 mg Intravenous Q24H  . cefTRIAXone (ROCEPHIN)  IV  1 g Intravenous Once  . cefTRIAXone (ROCEPHIN)  IV  1 g Intravenous Q24H  . enoxaparin  40 mg Subcutaneous Q24H  . ondansetron (ZOFRAN) IV  4 mg Intravenous Once  . potassium chloride  40 mEq Oral Q4H  . DISCONTD: acetaminophen  650 mg Oral Once  . DISCONTD: albuterol  2.5 mg Nebulization Q6H  . DISCONTD: enoxaparin  1 mg/kg Subcutaneous Q12H   Continuous Infusions:   . sodium chloride 100 mL/hr at 05/24/11 0948  . DISCONTD: sodium chloride     PRN Meds:.acetaminophen, acetaminophen, albuterol, alum & mag hydroxide-simeth, chlorpheniramine-HYDROcodone, HYDROmorphone, ondansetron (ZOFRAN) IV, ondansetron, oxyCODONE, zolpidem, DISCONTD: acetaminophen, DISCONTD: acetaminophen, DISCONTD: alum & mag hydroxide-simeth, DISCONTD: HYDROmorphone,  DISCONTD: ondansetron (ZOFRAN) IV, DISCONTD: oxyCODONE, DISCONTD: zolpidem  Assessment/Plan: 1-Pneumonia due to infectious agent: Sputum culture, strep antigen in urine and Legionella antigen in urine are pending at this point. patient overall improving and feeling better. Continue IV antibiotics (day #2 a total antibiotic regimen). Once patient stable and eating and drinking better we can transition to by mouth antibiotics and finish treatment as an outpatient.  2-Shortness of breath: Improve and; but is still requiring oxygen supplementation something that he doesn't use as an outpatient. Will continue slowly weaning titration to room air.  3-Fever: Secondary to problem #1. Continue antipyretics as needed and follow blood cultures.  4-AKI (acute kidney injury): Most likely etiology is dehydration and prerenal azotemia; continue fluid resuscitation.  5-Dehydration: Improving with IV fluids.  6-Hypokalemia: Will replete and check magnesium level. BMET to be repeated in am.  7-Hyponatremia: Most likely secondary to dehydration. Resolved after fluid resuscitation.  8-Debility: Secondary to acute infection and deconditioning. We'll ask physical therapy/occupational therapy to evaluate and treat patient.  9-thrombocytopenia: Will check peripheral smear and LDH. Most likely secondary to acute infection. No signs of acute bleeding. CBC in am. Avoid heparin products.  10-DVT: SCDs.    LOS: 1 day   Melayna Robarts 05/24/2011, 2:21 PM

## 2011-05-25 LAB — LEGIONELLA ANTIGEN, URINE

## 2011-05-25 LAB — CBC
MCHC: 33.6 g/dL (ref 30.0–36.0)
MCV: 86.8 fL (ref 78.0–100.0)
Platelets: 83 10*3/uL — ABNORMAL LOW (ref 150–400)
RDW: 14.7 % (ref 11.5–15.5)
WBC: 5.5 10*3/uL (ref 4.0–10.5)

## 2011-05-25 LAB — MAGNESIUM: Magnesium: 1.9 mg/dL (ref 1.5–2.5)

## 2011-05-25 LAB — URINE CULTURE
Culture: NO GROWTH
Culture: NO GROWTH

## 2011-05-25 MED ORDER — MOXIFLOXACIN HCL 400 MG PO TABS
400.0000 mg | ORAL_TABLET | Freq: Every day | ORAL | Status: DC
  Administered 2011-05-25: 400 mg via ORAL
  Filled 2011-05-25 (×2): qty 1

## 2011-05-25 MED ORDER — MAGNESIUM SULFATE 40 MG/ML IJ SOLN
2.0000 g | Freq: Once | INTRAMUSCULAR | Status: AC
  Administered 2011-05-25: 2 g via INTRAVENOUS
  Filled 2011-05-25: qty 50

## 2011-05-25 MED ORDER — POTASSIUM CHLORIDE 20 MEQ/15ML (10%) PO LIQD
40.0000 meq | Freq: Once | ORAL | Status: AC
  Administered 2011-05-25: 40 meq via ORAL
  Filled 2011-05-25: qty 30

## 2011-05-25 NOTE — Progress Notes (Signed)
Javier Phillips is a 65 y.o. male patient admitted with CAP. He feels better.    1. Community acquired pneumonia     Past Medical History  Diagnosis Date  . Enlarged prostate   . Heart murmur     nonsymptomatic   Current Facility-Administered Medications  Medication Dose Route Frequency Provider Last Rate Last Dose  . 0.9 %  sodium chloride infusion   Intravenous Continuous Ron Parker, MD 100 mL/hr at 05/25/11 0735    . acetaminophen (TYLENOL) tablet 650 mg  650 mg Oral Q6H PRN Ron Parker, MD   650 mg at 05/24/11 2147   Or  . acetaminophen (TYLENOL) suppository 650 mg  650 mg Rectal Q6H PRN Ron Parker, MD      . albuterol (PROVENTIL) (5 MG/ML) 0.5% nebulizer solution 2.5 mg  2.5 mg Nebulization Q2H PRN Ron Parker, MD      . albuterol (PROVENTIL) (5 MG/ML) 0.5% nebulizer solution 2.5 mg  2.5 mg Nebulization TID Vassie Loll, MD   2.5 mg at 05/25/11 1947  . alum & mag hydroxide-simeth (MAALOX/MYLANTA) 200-200-20 MG/5ML suspension 30 mL  30 mL Oral Q6H PRN Ron Parker, MD      . azithromycin (ZITHROMAX) 500 mg in dextrose 5 % 250 mL IVPB  500 mg Intravenous Once Carleene Cooper III, MD      . azithromycin (ZITHROMAX) 500 mg in dextrose 5 % 250 mL IVPB  500 mg Intravenous Q24H Ron Parker, MD   500 mg at 05/25/11 1640  . cefTRIAXone (ROCEPHIN) 1 g in dextrose 5 % 50 mL IVPB  1 g Intravenous Q24H Ron Parker, MD   1 g at 05/25/11 1607  . chlorpheniramine-HYDROcodone (TUSSIONEX) 10-8 MG/5ML suspension 5 mL  5 mL Oral Q12H PRN Ron Parker, MD   5 mL at 05/24/11 2149  . HYDROmorphone (DILAUDID) injection 0.5-1 mg  0.5-1 mg Intravenous Q3H PRN Ron Parker, MD      . ondansetron (ZOFRAN) injection 4 mg  4 mg Intravenous Q6H PRN Ron Parker, MD      . ondansetron (ZOFRAN) tablet 4 mg  4 mg Oral Q6H PRN Ron Parker, MD      . oxyCODONE (Oxy IR/ROXICODONE) immediate release tablet 5 mg  5 mg Oral Q4H PRN Ron Parker, MD      . zolpidem (AMBIEN) tablet 5 mg  5 mg Oral QHS PRN Ron Parker, MD       No Known Allergies Principal Problem:  *Pneumonia due to infectious agent Active Problems:  Shortness of breath  Fever  AKI (acute kidney injury)  Dehydration  Hypokalemia  Hyponatremia  Debility  Thrombocytopenia   Vital signs in last 24 hours: Temp:  [97.4 F (36.3 C)-101.4 F (38.6 C)] 98.5 F (36.9 C) (12/09 1324) Pulse Rate:  [72-86] 77  (12/09 1324) Resp:  [18-20] 18  (12/09 1324) BP: (100-106)/(60-67) 105/67 mmHg (12/09 1324) SpO2:  [90 %-97 %] 94 % (12/09 1947) FiO2 (%):  [21 %] 21 % (12/09 1501) Weight change:  Last BM Date: 05/22/11  Intake/Output from previous day: 12/08 0701 - 12/09 0700 In: 4570 [P.O.:4570] Out: 1550 [Urine:1550] Intake/Output this shift:    Lab Results:  Basename 05/24/11 2319 05/24/11 0500  WBC 5.5 7.2  HGB 11.1* 11.7*  HCT 33.0* 34.8*  PLT 83* 89*   BMET  Basename 05/24/11 0500 05/24/11 0015 05/23/11 1425  NA 136 -- 131*  K 3.4* -- 3.5  CL 103 -- 95*  CO2 26 -- 25  GLUCOSE 97 -- 114*  BUN 13 -- 19  CREATININE 1.25 1.20 --  CALCIUM 7.9* -- 8.5    Studies/Results: No results found.  Medications: I have reviewed the patient's current medications.   Physical exam GENERAL- alert and well HEAD- normal atraumatic, no neck masses, normal thyroid, no jvd RESPIRATORY- right sided basilar rhonchi. CVS- regular rate and rhythm, S1, S2 normal, no murmur, click, rub or gallop ABDOMEN- abdomen is soft without significant tenderness, masses, organomegaly or guarding NEURO- Grossly normal EXTREMITIES- extremities normal, atraumatic, no cyanosis or edema  Plan   *Pneumonia due to infectious agent/Shortness of breath - clinically better. Add avelox to transition to po antibiotics in am.  * AKI (acute kidney injury)- resolved.  * Hypokalemia- replenish magnesium as well.  *Thrombocytopenia-? Chronic. Possible d/c in  am.    Javier Phillips 05/25/2011 8:46 PM Pager: [phone].

## 2011-05-26 LAB — COMPREHENSIVE METABOLIC PANEL
BUN: 7 mg/dL (ref 6–23)
CO2: 29 mEq/L (ref 19–32)
Chloride: 108 mEq/L (ref 96–112)
Creatinine, Ser: 1 mg/dL (ref 0.50–1.35)
GFR calc non Af Amer: 77 mL/min — ABNORMAL LOW (ref >90)
Total Bilirubin: 0.5 mg/dL (ref 0.3–1.2)

## 2011-05-26 LAB — CBC
Hemoglobin: 11.8 g/dL — ABNORMAL LOW (ref 13.0–17.0)
RDW: 14.6 % (ref 11.5–15.5)

## 2011-05-26 MED ORDER — PNEUMOCOCCAL VAC POLYVALENT 25 MCG/0.5ML IJ INJ
0.5000 mL | INJECTION | Freq: Once | INTRAMUSCULAR | Status: AC
  Administered 2011-05-26: 0.5 mL via INTRAMUSCULAR
  Filled 2011-05-26: qty 0.5

## 2011-05-26 MED ORDER — INFLUENZA VIRUS VACC SPLIT PF IM SUSP
0.5000 mL | INTRAMUSCULAR | Status: AC | PRN
  Administered 2011-05-26: 0.5 mL via INTRAMUSCULAR
  Filled 2011-05-26: qty 0.5

## 2011-05-26 MED ORDER — MOXIFLOXACIN HCL 400 MG PO TABS: 400.0000 mg | ORAL_TABLET | Freq: Every day | ORAL | Status: AC

## 2011-05-26 NOTE — Progress Notes (Signed)
Physical Therapy Treatment Patient Details Name: Javier Phillips MRN: [id number] DOB: 05/27/1946 Today's Date: 05/26/2011  PT Assessment/Plan  PT - Assessment/Plan Comments on Treatment Session: Pt moving very well.  States he does not feel like he is at his baseline with strength & balance.  Educated pt on HEP.    Pt met PT goals today, but will ensure consistency with performance & initiate higher level balance training next session.  Pt should be able to be D/C'd from PT services after one more session.   PT Frequency: Min 3X/week Follow Up Recommendations: None Equipment Recommended: None recommended by PT PT Goals  Acute Rehab PT Goals PT Goal: Sit to Stand - Progress: Met PT Goal: Ambulate - Progress: Met PT Goal: Up/Down Stairs - Progress: Met  PT Treatment Precautions/Restrictions  Restrictions Weight Bearing Restrictions: No Mobility (including Balance) Transfers Sit to Stand: 7: Independent Stand to Sit: 7: Independent Ambulation/Gait Ambulation/Gait Assistance: 7: Independent Ambulation/Gait Assistance Details (indicate cue type and reason): Performed various gait challenges such as horizontal/vertical head turns, directional changes, gait speed changes, retropulsion.  No LOB noted.  Pt does state that he does not feel his balance is at baseline.   Ambulation Distance (Feet): 400 Feet Assistive device: None Gait Pattern: Step-through pattern Stairs: Yes Stairs Assistance: 6: Modified independent (Device/Increase time) Stair Management Technique: One rail Right;Forwards;Alternating pattern Number of Stairs: 5  (2x's)    Exercise  General Exercises - Lower Extremity Hip ABduction/ADduction: Strengthening;15 reps;Standing;Both Heel Raises: Strengthening;15 reps;Standing;Both Mini-Sqauts: Strengthening;15 reps;Both;Standing Standing Hip Extension; 15 reps; Both; Standing End of Session PT - End of Session Activity Tolerance: Patient tolerated treatment  well Patient left: in chair General Behavior During Session: [facility] for tasks performed Cognition: [facility] for tasks performed  Lara Mulch 05/26/2011, 2:51 PM [phone]

## 2011-05-26 NOTE — Discharge Summary (Signed)
DISCHARGE SUMMARY  Javier Phillips  MR#: [id number]  DOB:1946-02-20  Date of Admission: 05/23/2011 Date of Discharge: 05/26/2011  Attending Physician:Caly Pellum  Patient's PCP:No primary provider on file.  Consults: none  Discharge Diagnoses: Present on Admission:  .Pneumonia due to infectious agent .Shortness of breath .Fever .AKI (acute kidney injury) .Dehydration .Hyponatremia .Debility .Thrombocytopenia    Current Discharge Medication List    START taking these medications   Details  moxifloxacin (AVELOX) 400 MG tablet Take 1 tablet (400 mg total) by mouth daily at 6 PM. Qty: 6 tablet, Refills: 0      CONTINUE these medications which have NOT CHANGED   Details  acetaminophen (TYLENOL) 325 MG tablet Take 650 mg by mouth every 6 (six) hours as needed. For fever     Tamsulosin HCl (FLOMAX) 0.4 MG CAPS Take 0.4 mg by mouth daily.       STOP taking these medications     amoxicillin (AMOXIL) 875 MG tablet           Hospital Course: Mr Javier Phillips was admitted on 05/24/11 with cough/sob/weakness for 1 week. He was found to have RUL PNA. WBC on admission was 7.8 with 94% neutrophils. Patient was hence started on ceftriaxone/zithromax. He has done relatively well and is discharged on moxifloxacin to complete 10 day course of abx. Patient noted to have thrombocytopenia felt to be due to the acute infection. I will defer further work up to Dr Zachery Dauer as necessary. Plt  Count 119 today, an improvement from 94 on admission. The platelet count was normal in January this year. Doesn't seem like patient received heparin products recently. He is discharged in stable condition.  Day of Discharge BP 109/72  Pulse 68  Temp(Src) 98 F (36.7 C) (Oral)  Resp 20  Ht 5\' 7"  (1.702 m)  Wt 60.78 kg (133 lb 15.9 oz)  BMI 20.99 kg/m2  SpO2 96%  Physical Exam: No rhonchi both lung fields.  Results for orders placed during the hospital encounter of 05/23/11 (from the past  24 hour(s))  CBC     Status: Abnormal   Collection Time   05/26/11  7:50 AM      Component Value Range   WBC 5.5  4.0 - 10.5 (K/uL)   RBC 4.10 (*) 4.22 - 5.81 (MIL/uL)   Hemoglobin 11.8 (*) 13.0 - 17.0 (g/dL)   HCT 40.9 (*) 81.1 - 52.0 (%)   MCV 86.8  78.0 - 100.0 (fL)   MCH 28.8  26.0 - 34.0 (pg)   MCHC 33.1  30.0 - 36.0 (g/dL)   RDW 91.4  78.2 - 95.6 (%)   Platelets 119 (*) 150 - 400 (K/uL)  COMPREHENSIVE METABOLIC PANEL     Status: Abnormal   Collection Time   05/26/11  7:50 AM      Component Value Range   Sodium 143  135 - 145 (mEq/L)   Potassium 4.3  3.5 - 5.1 (mEq/L)   Chloride 108  96 - 112 (mEq/L)   CO2 29  19 - 32 (mEq/L)   Glucose, Bld 101 (*) 70 - 99 (mg/dL)   BUN 7  6 - 23 (mg/dL)   Creatinine, Ser 2.13  0.50 - 1.35 (mg/dL)   Calcium 8.5  8.4 - 08.6 (mg/dL)   Total Protein 6.4  6.0 - 8.3 (g/dL)   Albumin 2.5 (*) 3.5 - 5.2 (g/dL)   AST 25  0 - 37 (U/L)   ALT 15  0 - 53 (U/L)   Alkaline Phosphatase  54  39 - 117 (U/L)   Total Bilirubin 0.5  0.3 - 1.2 (mg/dL)   GFR calc non Af Amer 77 (*) >90 (mL/min)   GFR calc Af Amer 89 (*) >90 (mL/min)    Disposition: home.   Follow-up Appts: Discharge Orders    Future Orders Please Complete By Expires   Diet - low sodium heart healthy      Increase activity slowly         Follow-up with Dr. Juluis Rainier in 2 weeks.   Tests Needing Follow-up: CBC to follow platelet count. Time spent for d/c 25 minutes. Signed: Attilio Zeitler 05/26/2011, 3:02 PM

## 2011-05-27 NOTE — [facility] Notes (Signed)
   CARE MANAGEMENT NOTE 05/27/2011  Patient:  Javier Phillips, Javier Phillips   Account Number:  [id number]  Date Initiated:  05/27/2011  Documentation initiated by:  Onnie Boer  Subjective/Objective Assessment:   PT WAS ADMITTED WITH PNA AFTER FAILED OP TREATMENT     Action/Plan:   PROGRESSION OF CARE AND DISCHARGE PLANNING   Anticipated DC Date:  05/26/2011   Anticipated DC Plan:  HOME/SELF CARE         Choice offered to / List presented to:             Status of service:  Completed, signed off Medicare Important Message given?   (If response is "NO", the following Medicare IM given date fields will be blank) Date Medicare IM given:   Date Additional Medicare IM given:    Discharge Disposition:    Per UR Regulation:  Reviewed for med. necessity/level of care/duration of stay  Comments:  UR COMPLETED 05/27/2011 Onnie Boer, RN, BSN [id number]  PT WAS DC'D TO HOME WITH SELF CARE

## 2011-05-29 LAB — CULTURE, BLOOD (ROUTINE X 2)
Culture  Setup Time: 201212072315
Culture: NO GROWTH

## 2014-11-28 DIAGNOSIS — H04123 Dry eye syndrome of bilateral lacrimal glands: Secondary | ICD-10-CM | POA: Diagnosis not present

## 2014-11-28 DIAGNOSIS — H2513 Age-related nuclear cataract, bilateral: Secondary | ICD-10-CM | POA: Diagnosis not present

## 2014-11-28 DIAGNOSIS — H10413 Chronic giant papillary conjunctivitis, bilateral: Secondary | ICD-10-CM | POA: Diagnosis not present

## 2015-02-16 DIAGNOSIS — H10413 Chronic giant papillary conjunctivitis, bilateral: Secondary | ICD-10-CM | POA: Diagnosis not present

## 2015-02-16 DIAGNOSIS — H04123 Dry eye syndrome of bilateral lacrimal glands: Secondary | ICD-10-CM | POA: Diagnosis not present

## 2015-05-29 ENCOUNTER — Encounter: Payer: Self-pay | Admitting: Gastroenterology

## 2015-06-08 DIAGNOSIS — Z23 Encounter for immunization: Secondary | ICD-10-CM | POA: Diagnosis not present

## 2015-07-19 ENCOUNTER — Encounter: Payer: BC Managed Care – PPO | Admitting: Gastroenterology

## 2016-12-18 DIAGNOSIS — IMO0001 Reserved for inherently not codable concepts without codable children: Secondary | ICD-10-CM | POA: Insufficient documentation

## 2016-12-18 DIAGNOSIS — H905 Unspecified sensorineural hearing loss: Secondary | ICD-10-CM | POA: Insufficient documentation

## 2016-12-18 DIAGNOSIS — H6521 Chronic serous otitis media, right ear: Secondary | ICD-10-CM | POA: Insufficient documentation

## 2017-01-26 DIAGNOSIS — Z9622 Myringotomy tube(s) status: Secondary | ICD-10-CM | POA: Insufficient documentation

## 2018-02-03 ENCOUNTER — Other Ambulatory Visit: Payer: Self-pay | Admitting: Otolaryngology

## 2018-02-03 DIAGNOSIS — H9041 Sensorineural hearing loss, unilateral, right ear, with unrestricted hearing on the contralateral side: Secondary | ICD-10-CM

## 2018-02-03 DIAGNOSIS — IMO0001 Reserved for inherently not codable concepts without codable children: Secondary | ICD-10-CM

## 2018-02-08 ENCOUNTER — Ambulatory Visit
Admission: RE | Admit: 2018-02-08 | Discharge: 2018-02-08 | Disposition: A | Discharge status: Home / Self Care | Payer: Medicare Other | Source: Ambulatory Visit | Attending: Otolaryngology | Admitting: Otolaryngology

## 2018-02-08 DIAGNOSIS — H9041 Sensorineural hearing loss, unilateral, right ear, with unrestricted hearing on the contralateral side: Secondary | ICD-10-CM

## 2018-02-08 DIAGNOSIS — IMO0001 Reserved for inherently not codable concepts without codable children: Secondary | ICD-10-CM

## 2018-02-08 MED ORDER — GADOBENATE DIMEGLUMINE 529 MG/ML IV SOLN
10.0000 mL | Freq: Once | INTRAVENOUS | Status: AC | PRN
  Administered 2018-02-08: 10 mL via INTRAVENOUS

## 2020-08-01 DIAGNOSIS — R059 Cough, unspecified: Secondary | ICD-10-CM | POA: Diagnosis not present

## 2020-09-07 DIAGNOSIS — R0789 Other chest pain: Secondary | ICD-10-CM | POA: Diagnosis not present

## 2020-09-07 DIAGNOSIS — R059 Cough, unspecified: Secondary | ICD-10-CM | POA: Diagnosis not present

## 2020-09-07 DIAGNOSIS — J8410 Pulmonary fibrosis, unspecified: Secondary | ICD-10-CM | POA: Diagnosis not present

## 2020-09-07 DIAGNOSIS — R0609 Other forms of dyspnea: Secondary | ICD-10-CM | POA: Diagnosis not present

## 2020-09-07 DIAGNOSIS — I341 Nonrheumatic mitral (valve) prolapse: Secondary | ICD-10-CM | POA: Diagnosis not present

## 2020-09-12 NOTE — [facility] Notes (Signed)
Cardiology Office Note   Date:  09/13/2020   ID:  Javier Phillips, Javier Phillips 01-04-1946, MRN [id number]  PCP:  Javier Floro, Phillips  Cardiologist:   No primary care provider on file. Referring:  Javier Floro, Phillips  Chief Complaint  Patient presents with  . Shortness of Breath      History of Present Illness: Javier Phillips is a 75 y.o. male who is referred by Javier Floro, Phillips for evaluation of DOE.  He has a history of MVP.    He is never really had any problems until he says a few months ago.  He started having increasing shortness of breath.  This seems to be a little bit intermittent.  It happens when he climbs a flight of stairs he might get breathless.  Other days he might not be.  He is not really describing PND or orthopnea although he does not sleep well so he goes to sleep in a chair frequently.  He has not had any weight gain or edema.  He denies any chest pressure, neck or arm discomfort.  He has no palpitations, presyncope or syncope.  Past Medical History:  Diagnosis Date  . Enlarged prostate   . Heart murmur    Diagnosis of MVP years ago  . Interstitial cystitis     Past Surgical History:  Procedure Laterality Date  . HERNIA REPAIR       Current Outpatient Medications  Medication Sig Dispense Refill  . acetaminophen (TYLENOL) 325 MG tablet Take 650 mg by mouth every 6 (six) hours as needed. For fever    . amitriptyline (ELAVIL) 10 MG tablet 1 tablet at bedtime.    . cetirizine (ZYRTEC) 10 MG tablet Take 10 mg by mouth daily.    . famotidine (PEPCID) 20 MG tablet Take 20 mg by mouth daily as needed.    . fluticasone (FLONASE) 50 MCG/ACT nasal spray Place into both nostrils daily as needed.    Marland Kitchen oxybutynin (DITROPAN-XL) 10 MG 24 hr tablet Take 10 mg by mouth daily.    . Tamsulosin HCl (FLOMAX) 0.4 MG CAPS Take 0.4 mg by mouth daily.     No current facility-administered medications for this visit.    Allergies:   Aspirin    Social  History:  The patient  reports that he has never smoked. He has never used smokeless tobacco. He reports that he does not drink alcohol and does not use drugs.   Family History:  The patient's family history includes Dementia in his brother and father; Stroke in his mother.    ROS:  Please see the history of present illness.   Otherwise, review of systems are positive for none.   All other systems are reviewed and negative.    PHYSICAL EXAM: VS:  BP 100/60 (BP Location: Left Arm, Patient Position: Sitting)   Pulse 80   Ht 5' 5.5" (1.664 m)   Wt 142 lb 9.6 oz (64.7 kg)   SpO2 97%   BMI 23.37 kg/m  , BMI Body mass index is 23.37 kg/m. GENERAL:  Well appearing HEENT:  Pupils equal round and reactive, fundi not visualized, oral mucosa unremarkable NECK:  No jugular venous distention, waveform within normal limits, carotid upstroke brisk and symmetric, no bruits, no thyromegaly LYMPHATICS:  No cervical, inguinal adenopathy LUNGS: Diffuse fine crackles left greater than right BACK:  No CVA tenderness CHEST:  Unremarkable HEART:  PMI not displaced or sustained,S1 and S2 within normal limits, no  S3, no S4, no clicks, no rubs, 3 out of 6 holosystolic murmur, no diastolic murmurs ABD:  Flat, positive bowel sounds normal in frequency in pitch, no bruits, no rebound, no guarding, no midline pulsatile mass, no hepatomegaly, no splenomegaly EXT:  2 plus pulses throughout, no edema, no cyanosis no clubbing SKIN:  No rashes no nodules NEURO:  Cranial nerves II through XII grossly intact, motor grossly intact throughout PSYCH:  Cognitively intact, oriented to person place and time    EKG:  EKG is ordered today. The ekg ordered today demonstrates sinus rhythm, rate 80, axis within normal limits, possible left atrial enlargement, no acute ST-T wave changes, nonspecific lateral T wave flattening.   Recent Labs: No results found for requested labs within last 8760 hours.    Lipid Panel No  results found for: CHOL, TRIG, HDL, CHOLHDL, VLDL, LDLCALC, LDLDIRECT    Wt Readings from Last 3 Encounters:  09/13/20 142 lb 9.6 oz (64.7 kg)  05/23/11 133 lb 15.9 oz (60.8 kg)      Other studies Reviewed: Additional studies/ records that were reviewed today include: Labs and chest x-ray. Review of the above records demonstrates:  Please see elsewhere in the note.     ASSESSMENT AND PLAN:  DOE: His chest x-ray demonstrates pulmonary fibrosis.  I suspect that is the etiology of his dyspnea and he has a pulmonary appointment pending.  MURMUR: I suspect mitral regurgitation.  I am going to get an echocardiogram.  He reports although I cannot find it that his BNP was normal which would argue against the regurgitation being the etiology of his dyspnea.  I determined the frequency and cadence of follow-up based on the echocardiogram results.   Current medicines are reviewed at length with the patient today.  The patient does not have concerns regarding medicines.  The following changes have been made:  no change  Labs/ tests ordered today include:   Orders Placed This Encounter  Procedures  . EKG 12-Lead  . ECHOCARDIOGRAM COMPLETE     Disposition:   FU with me in six months.     Signed, Javier Rotunda, Phillips  09/13/2020 9:51 AM    [facility]

## 2020-09-13 ENCOUNTER — Encounter: Payer: Self-pay | Admitting: Cardiology

## 2020-09-13 ENCOUNTER — Ambulatory Visit: Payer: Medicare PPO | Admitting: Cardiology

## 2020-09-13 ENCOUNTER — Other Ambulatory Visit: Payer: Self-pay

## 2020-09-13 VITALS — BP 100/60 | HR 80 | Ht 65.5 in | Wt 142.6 lb

## 2020-09-13 DIAGNOSIS — R0600 Dyspnea, unspecified: Secondary | ICD-10-CM | POA: Diagnosis not present

## 2020-09-13 DIAGNOSIS — I340 Nonrheumatic mitral (valve) insufficiency: Secondary | ICD-10-CM | POA: Diagnosis not present

## 2020-09-13 DIAGNOSIS — R0609 Other forms of dyspnea: Secondary | ICD-10-CM

## 2020-09-13 NOTE — Patient Instructions (Signed)
Medication Instructions:  Continue current medications  *If you need a refill on your cardiac medications before your next appointment, please call your pharmacy*   Lab Work: None Ordered   Testing/Procedures: Your physician has requested that you have an echocardiogram. Echocardiography is a painless test that uses sound waves to create images of your heart. It provides your doctor with information about the size and shape of your heart and how well your heart's chambers and valves are working. This procedure takes approximately one hour. There are no restrictions for this procedure.   Follow-Up: At [facility], you and your health needs are our priority.  As part of our continuing mission to provide you with exceptional heart care, we have created designated Provider Care Teams.  These Care Teams include your primary Cardiologist (physician) and Advanced Practice Providers (APPs -  Physician Assistants and Nurse Practitioners) who all work together to provide you with the care you need, when you need it.  We recommend signing up for the patient portal called "MyChart".  Sign up information is provided on this After Visit Summary.  MyChart is used to connect with patients for Virtual Visits (Telemedicine).  Patients are able to view lab/test results, encounter notes, upcoming appointments, etc.  Non-urgent messages can be sent to your provider as well.   To learn more about what you can do with MyChart, go to [email or web address].com.    Your next appointment:   6 month(s)  The format for your next appointment:   In Person  Provider:   You may see James Hochrein, MD or one of the following Advanced Practice Providers on your designated Care Team:    Rhonda Barrett, PA-C  Kathryn Lawrence, DNP, ANP      

## 2020-10-05 ENCOUNTER — Encounter: Payer: Self-pay | Admitting: Pulmonary Disease

## 2020-10-05 ENCOUNTER — Ambulatory Visit: Payer: Medicare PPO | Admitting: Pulmonary Disease

## 2020-10-05 ENCOUNTER — Other Ambulatory Visit: Payer: Self-pay

## 2020-10-05 DIAGNOSIS — J849 Interstitial pulmonary disease, unspecified: Secondary | ICD-10-CM

## 2020-10-05 NOTE — Patient Instructions (Signed)
I have reviewed your chest x-ray which shows some changes suggestive of interstitial lung disease This is a time to use to call any inflammation and scarring in the lung  To further investigate this we will send some blood tests, schedule high-res CT, PFTs and give you questionnaire to fill out  Follow-up in clinic after completion of this test to review

## 2020-10-05 NOTE — [facility] Notes (Signed)
Javier Phillips    [id number]    [birth year]  Primary Care Physician:Ross, Darlen Round, MD  Referring Physician: Daisy Floro, MD [address] [place],  [place] [address]  Chief complaint: Consult for abnormal chest x-ray  HPI: 75 year old with history of mitral valve prolapse, GERD Complains of increasing dyspnea on exertion for the past 4 months.  This is associated with a dry hacking cough.  He is on albuterol and cough drops which help.  Cough is exacerbated by exposure to cigarette smoke, freshly cut grass  Stays active with exercise at [facility], swimming but this has been limited somewhat recently Has followed with Dr. Antoine Poche for mitral valve prolapse  Denies any joint pain, rash.  He has dry eyes.  Occasional dysphagia with choking on dry crackers.   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  Wife uses a down pillow.   Smoking history: Never smoker Travel history: No significant travel history Relevant family history: Has family history of asthma and emphysema   Outpatient Encounter Medications as of 10/05/2020  Medication Sig  . acetaminophen (TYLENOL) 325 MG tablet Take 650 mg by mouth every 6 (six) hours as needed. For fever  . Albuterol Sulfate (PROAIR RESPICLICK) 108 (90 Base) MCG/ACT AEPB   . amitriptyline (ELAVIL) 10 MG tablet 1 tablet at bedtime.  . cetirizine (ZYRTEC) 10 MG tablet Take 10 mg by mouth daily.  . famotidine (PEPCID) 20 MG tablet Take 20 mg by mouth daily as needed.  . fluticasone (FLONASE) 50 MCG/ACT nasal spray Place into both nostrils daily as needed.  Marland Kitchen oxybutynin (DITROPAN-XL) 10 MG 24 hr tablet Take 10 mg by mouth daily.  . Tamsulosin HCl (FLOMAX) 0.4 MG CAPS Take 0.4 mg by mouth daily.   No facility-administered encounter medications on file as of 10/05/2020.    Allergies as of 10/05/2020 - Review Complete  10/05/2020  Allergen Reaction Noted  . Aspirin Other (See Comments) 06/17/2014    Past Medical History:  Diagnosis Date  . Enlarged prostate   . Heart murmur    Diagnosis of MVP years ago  . Interstitial cystitis     Past Surgical History:  Procedure Laterality Date  . HERNIA REPAIR      Family History  Problem Relation Age of Onset  . Stroke Mother   . Dementia Father   . Dementia Brother     Social History   Socioeconomic History  . Marital status: Married    Spouse name: Not on file  . Number of children: Not on file  . Years of education: Not on file  . Highest education level: Not on file  Occupational History  . Not on file  Tobacco Use  . Smoking status: Never Smoker  . Smokeless tobacco: Never Used  Substance and Sexual Activity  . Alcohol use: No  . Drug use: No  . Sexual activity: Yes  Other Topics Concern  . Not on file  Social History Narrative  . Not on file   Social Determinants of Health   Financial Resource Strain: Not on file  Food Insecurity: Not on file  Transportation Needs: Not on file  Physical Activity: Not on file  Stress: Not on file  Social Connections: Not on file  Intimate Partner Violence: Not on file    Review of systems: Review of Systems  Constitutional: Negative for fever and chills.  HENT: Negative.   Eyes: Negative for blurred vision.  Respiratory: as per HPI  Cardiovascular: Negative for chest pain and palpitations.  Gastrointestinal: Negative for vomiting, diarrhea, blood per rectum. Genitourinary: Negative for dysuria, urgency, frequency and hematuria.  Musculoskeletal: Negative for myalgias, back pain and joint pain.  Skin: Negative for itching and rash.  Neurological: Negative for dizziness, tremors, focal weakness, seizures and loss of consciousness.  Endo/Heme/Allergies: Negative for environmental allergies.  Psychiatric/Behavioral: Negative for depression, suicidal ideas and hallucinations.  All other  systems reviewed and are negative.  Physical Exam: Blood pressure 126/80, pulse 78, temperature 98 F (36.7 C), height 5\' 5"  (1.651 m), weight 139 lb (63 kg), SpO2 97 %. Gen:      No acute distress HEENT:  EOMI, sclera anicteric Neck:     No masses; no thyromegaly Lungs:    Bibasal crackles, scoliosis of the spine CV:         Regular rate and rhythm; no murmurs Abd:      + bowel sounds; soft, non-tender; no palpable masses, no distension Ext:    No edema; adequate peripheral perfusion Skin:      Warm and dry; no rash Neuro: alert and oriented x 3 Psych: normal mood and affect  Data Reviewed: Imaging: Chest x-ray 09/10/2020- interstitial changes at the lung base suggestive of pulmonary fibrosis.  I have reviewed the images personally  PFTs:  Labs: Labs from primary care 09/07/2020 BNP -23.5 Comprehensive metabolic panel-normal with normal liver function CBC WBC 8.3, hemoglobin 13.2, platelets 152, 0% eos  Assessment:  Interstitial lung disease Will need full assessment with high-res CT, PFTs. Send ILD panel and questionnaire to determine exposures Exposures notable for down and mold and concern for aspiration given intermittent choking episodes  Discussed work-up and further follow-up in detail with patient and wife today Return to clinic in 1 to 2 months for review of test results and plan for next steps  Plan/Recommendations: ILD serologies, ILD questionnaire High-res CT, PFTs  09/09/2020 MD [facility] 10/05/2020, 9:38 AM  CC: 10/07/2020, MD

## 2020-10-09 LAB — ANTI-SCLERODERMA ANTIBODY: Scleroderma (Scl-70) (ENA) Antibody, IgG: <1 AI

## 2020-10-10 LAB — ANA,IFA RA DIAG PNL W/RFLX TIT/PATN
Anti Nuclear Antibody (ANA): POSITIVE — AB
Cyclic Citrullin Peptide Ab: <16 UNITS
Rhuematoid fact SerPl-aCnc: 22 IU/mL — ABNORMAL HIGH (ref <14)

## 2020-10-10 LAB — ANTI-NUCLEAR AB-TITER (ANA TITER): ANA Titer 1: 1:640 {titer} — ABNORMAL HIGH

## 2020-10-10 LAB — SJOGREN'S SYNDROME ANTIBODS(SSA + SSB)
SSA (Ro) (ENA) Antibody, IgG: <1 AI
SSB (La) (ENA) Antibody, IgG: <1 AI

## 2020-10-10 LAB — ANCA SCREEN W REFLEX TITER: ANCA Screen: NEGATIVE

## 2020-10-12 ENCOUNTER — Other Ambulatory Visit: Payer: Medicare PPO

## 2020-10-12 LAB — HYPERSENSITIVITY PNEUMONITIS
A. Pullulans Abs: NEGATIVE
A.Fumigatus #1 Abs: NEGATIVE
Micropolyspora faeni, IgG: NEGATIVE
Pigeon Serum Abs: NEGATIVE
Thermoact. Saccharii: NEGATIVE
Thermoactinomyces vulgaris, IgG: NEGATIVE

## 2020-10-19 ENCOUNTER — Ambulatory Visit (INDEPENDENT_AMBULATORY_CARE_PROVIDER_SITE_OTHER)
Admission: RE | Admit: 2020-10-19 | Discharge: 2020-10-19 | Disposition: A | Discharge status: Home / Self Care | Payer: Medicare PPO | Source: Ambulatory Visit | Attending: Pulmonary Disease | Admitting: Pulmonary Disease

## 2020-10-19 ENCOUNTER — Ambulatory Visit (HOSPITAL_COMMUNITY): Payer: Medicare PPO | Attending: Internal Medicine

## 2020-10-19 ENCOUNTER — Other Ambulatory Visit: Payer: Self-pay

## 2020-10-19 DIAGNOSIS — I2510 Atherosclerotic heart disease of native coronary artery without angina pectoris: Secondary | ICD-10-CM | POA: Diagnosis not present

## 2020-10-19 DIAGNOSIS — R0600 Dyspnea, unspecified: Secondary | ICD-10-CM | POA: Insufficient documentation

## 2020-10-19 DIAGNOSIS — I340 Nonrheumatic mitral (valve) insufficiency: Secondary | ICD-10-CM | POA: Diagnosis not present

## 2020-10-19 DIAGNOSIS — J849 Interstitial pulmonary disease, unspecified: Secondary | ICD-10-CM

## 2020-10-19 DIAGNOSIS — J8410 Pulmonary fibrosis, unspecified: Secondary | ICD-10-CM | POA: Diagnosis not present

## 2020-10-19 DIAGNOSIS — J479 Bronchiectasis, uncomplicated: Secondary | ICD-10-CM | POA: Diagnosis not present

## 2020-10-19 DIAGNOSIS — R0609 Other forms of dyspnea: Secondary | ICD-10-CM

## 2020-10-19 LAB — ECHOCARDIOGRAM COMPLETE
Area-P 1/2: 2.37 cm2
S' Lateral: 2.5 cm

## 2020-10-20 LAB — MYOMARKER 3 PLUS PROFILE (RDL)
Anti-EJ Ab (RDL): NEGATIVE
Anti-Jo-1 Ab (RDL): <20 Units (ref <20)
Anti-Ku Ab (RDL): NEGATIVE
Anti-MDA-5 Ab (CADM-140)(RDL): <20 Units (ref <20)
Anti-Mi-2 Ab (RDL): POSITIVE — AB
Anti-NXP-2 (P140) Ab (RDL): <20 Units (ref <20)
Anti-OJ Ab (RDL): NEGATIVE
Anti-PL-12 Ab (RDL: NEGATIVE
Anti-PL-7 Ab (RDL): NEGATIVE
Anti-PM/Scl-100 Ab (RDL): <20 Units (ref <20)
Anti-SAE1 Ab, IgG (RDL): <20 Units (ref <20)
Anti-SRP Ab (RDL): NEGATIVE
Anti-SS-A 52kD Ab, IgG (RDL): <20 Units (ref <20)
Anti-TIF-1gamma Ab (RDL): <20 Units (ref <20)
Anti-U1 RNP Ab (RDL): <20 Units (ref <20)
Anti-U2 RNP Ab (RDL): NEGATIVE
Anti-U3 RNP (Fibrillarin)(RDL): NEGATIVE

## 2020-10-30 ENCOUNTER — Telehealth: Payer: Self-pay

## 2020-10-30 DIAGNOSIS — J849 Interstitial pulmonary disease, unspecified: Secondary | ICD-10-CM

## 2020-10-30 NOTE — Telephone Encounter (Signed)
Left message for patient regarding results and recommendations.  Advised patient call back or view mychart.  Rhematology referral placed.

## 2020-10-30 NOTE — [facility] Notes (Signed)
Voicemail left for patient and Mychart message also sent: CT shows some scarring in the lung. In addition labs from last month show some abnormalities which may indicate an autoimmune process. Will refer to rheumatology Will discuss in detail when he returns after PFTs. Please contact the office if you have any questions.

## 2020-10-30 NOTE — Telephone Encounter (Signed)
-----   Message from Chilton Greathouse, MD sent at 10/30/2020  9:52 AM EDT ----- CT shows some scarring in the lung.  In addition his labs from last month show some abnormalities which may indicate an autoimmune process. Please make referral to rheumatology I will discuss in detail when he returns after PFTs.

## 2020-11-23 ENCOUNTER — Other Ambulatory Visit (HOSPITAL_COMMUNITY)
Admission: RE | Admit: 2020-11-23 | Discharge: 2020-11-23 | Disposition: A | Discharge status: Home / Self Care | Payer: Medicare PPO | Source: Ambulatory Visit | Attending: Pulmonary Disease | Admitting: Pulmonary Disease

## 2020-11-23 DIAGNOSIS — Z01812 Encounter for preprocedural laboratory examination: Secondary | ICD-10-CM | POA: Insufficient documentation

## 2020-11-23 DIAGNOSIS — Z20822 Contact with and (suspected) exposure to covid-19: Secondary | ICD-10-CM | POA: Diagnosis not present

## 2020-11-23 LAB — SARS CORONAVIRUS 2 (TAT 6-24 HRS): SARS Coronavirus 2: NEGATIVE

## 2020-11-25 ENCOUNTER — Other Ambulatory Visit: Payer: Self-pay

## 2020-11-27 ENCOUNTER — Ambulatory Visit (INDEPENDENT_AMBULATORY_CARE_PROVIDER_SITE_OTHER): Payer: Medicare PPO | Admitting: Pulmonary Disease

## 2020-11-27 ENCOUNTER — Encounter: Payer: Self-pay | Admitting: Pulmonary Disease

## 2020-11-27 ENCOUNTER — Other Ambulatory Visit: Payer: Self-pay

## 2020-11-27 VITALS — BP 116/60 | HR 81 | Temp 98.1°F | Ht 65.0 in | Wt 134.0 lb

## 2020-11-27 DIAGNOSIS — R059 Cough, unspecified: Secondary | ICD-10-CM | POA: Diagnosis not present

## 2020-11-27 DIAGNOSIS — J849 Interstitial pulmonary disease, unspecified: Secondary | ICD-10-CM

## 2020-11-27 LAB — CK: Total CK: 54 U/L (ref 7–232)

## 2020-11-27 LAB — PULMONARY FUNCTION TEST
DL/VA % pred: 120 %
DL/VA: 4.91 ml/min/mmHg/L
DLCO cor % pred: 88 %
DLCO cor: 19.14 ml/min/mmHg
DLCO unc % pred: 88 %
DLCO unc: 19.14 ml/min/mmHg
FEF 25-75 Post: 3.87 L/sec
FEF 25-75 Pre: 4.15 L/sec
FEF2575-%Change-Post: -6 %
FEF2575-%Pred-Post: 212 %
FEF2575-%Pred-Pre: 227 %
FEV1-%Change-Post: 4 %
FEV1-%Pred-Post: 79 %
FEV1-%Pred-Pre: 75 %
FEV1-Post: 1.97 L
FEV1-Pre: 1.89 L
FEV1FVC-%Change-Post: 0 %
FEV1FVC-%Pred-Pre: 126 %
FEV6-%Change-Post: 4 %
FEV6-%Pred-Post: 66 %
FEV6-%Pred-Pre: 63 %
FEV6-Post: 2.14 L
FEV6-Pre: 2.04 L
FEV6FVC-%Pred-Post: 107 %
FEV6FVC-%Pred-Pre: 107 %
FVC-%Change-Post: 4 %
FVC-%Pred-Post: 61 %
FVC-%Pred-Pre: 59 %
FVC-Post: 2.14 L
FVC-Pre: 2.05 L
Post FEV1/FVC ratio: 92 %
Post FEV6/FVC ratio: 100 %
Pre FEV1/FVC ratio: 92 %
Pre FEV6/FVC Ratio: 100 %
RV % pred: 65 %
RV: 1.48 L
TLC % pred: 63 %
TLC: 3.86 L

## 2020-11-27 NOTE — Progress Notes (Signed)
Full PFT performed today. °

## 2020-11-27 NOTE — Patient Instructions (Signed)
Full PFT performed today. °

## 2020-11-27 NOTE — Progress Notes (Signed)
Javier Phillips    [id number]    November 29, 1945  Primary Care Physician:Ross, Darlen Round, MD  Referring Physician: Daisy Floro, MD [address] [place],  [place] [address]  Chief complaint: Follow-up for interstitial lung disease  HPI: 75 year old with history of mitral valve prolapse, GERD Complains of increasing dyspnea on exertion for the past 4 months.  This is associated with a dry hacking cough.  He is on albuterol and cough drops which help.  Cough is exacerbated by exposure to cigarette smoke, freshly cut grass  Stays active with exercise at [facility], swimming but this has been limited somewhat recently Has followed with Dr. Antoine Poche for mitral valve prolapse  Denies any joint pain, rash.  He has dry eyes.  Occasional dysphagia with choking on dry crackers.   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  Wife uses a down pillow.   ILD questionnaire 11/27/20-negative except for above Smoking history: Never smoker Travel history: No significant travel history Relevant family history: Has family history of asthma and emphysema  Interim history:  Here for review of PFTs and CT scan States that breathing is stable  Outpatient Encounter Medications as of 11/27/2020  Medication Sig   acetaminophen (TYLENOL) 325 MG tablet Take 650 mg by mouth every 6 (six) hours as needed. For fever   Albuterol Sulfate (PROAIR RESPICLICK) 108 (90 Base) MCG/ACT AEPB    amitriptyline (ELAVIL) 10 MG tablet 1 tablet at bedtime.   cetirizine (ZYRTEC) 10 MG tablet Take 10 mg by mouth daily.   famotidine (PEPCID) 20 MG tablet Take 20 mg by mouth daily as needed.   fluticasone (FLONASE) 50 MCG/ACT nasal spray Place into both nostrils daily as needed.   oxybutynin (DITROPAN-XL) 10 MG 24 hr tablet Take 10 mg by mouth daily.   Tamsulosin HCl (FLOMAX) 0.4 MG CAPS Take 0.4 mg by  mouth daily.   No facility-administered encounter medications on file as of 11/27/2020.   Physical Exam: Blood pressure 116/60, pulse 81, temperature 98.1 F (36.7 C), temperature source Temporal, height 5\' 5"  (1.651 m), weight 134 lb (60.8 kg), SpO2 97 %. Gen:      No acute distress HEENT:  EOMI, sclera anicteric Neck:     No masses; no thyromegaly Lungs:    Bibasal crackles CV:         Regular rate and rhythm; no murmurs Abd:      + bowel sounds; soft, non-tender; no palpable masses, no distension Ext:    No edema; adequate peripheral perfusion Skin:      Warm and dry; no rash Neuro: alert and oriented x 3 Psych: normal mood and affect   Data Reviewed: Imaging: Chest x-ray 09/10/2020- interstitial changes at the lung base suggestive of pulmonary fibrosis.   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  We will check CK and aldolase given positive myositis panel I will also send him to speech pathology for modified barium swallow  Discussed further work-up including lung biopsy though they would like to avoid if possible. Follow-up in 1 to 2 months for reevaluation  Plan/Recommendations: Rheumatology  evaluation Modified barium swallow  Chilton Greathouse MD [facility] 11/27/2020, 10:16 AM  CC: Daisy Floro, MD

## 2020-11-27 NOTE — Patient Instructions (Signed)
Refer to speech pathology for modified barium swallow Keep with rheumatology assessment We will check CK and aldolase today  Follow-up in 1 to 2 months.

## 2020-11-28 LAB — ALDOLASE: Aldolase: 2.9 U/L (ref <=8.1)

## 2020-12-06 ENCOUNTER — Ambulatory Visit: Payer: Self-pay

## 2020-12-06 ENCOUNTER — Encounter: Payer: Self-pay | Admitting: Internal Medicine

## 2020-12-06 ENCOUNTER — Other Ambulatory Visit: Payer: Self-pay

## 2020-12-06 ENCOUNTER — Ambulatory Visit: Payer: Medicare PPO | Admitting: Internal Medicine

## 2020-12-06 VITALS — BP 132/81 | HR 76 | Ht 65.0 in | Wt 137.2 lb

## 2020-12-06 DIAGNOSIS — M7989 Other specified soft tissue disorders: Secondary | ICD-10-CM

## 2020-12-06 DIAGNOSIS — M79642 Pain in left hand: Secondary | ICD-10-CM

## 2020-12-06 DIAGNOSIS — D649 Anemia, unspecified: Secondary | ICD-10-CM | POA: Insufficient documentation

## 2020-12-06 DIAGNOSIS — R768 Other specified abnormal immunological findings in serum: Secondary | ICD-10-CM | POA: Diagnosis not present

## 2020-12-06 DIAGNOSIS — J309 Allergic rhinitis, unspecified: Secondary | ICD-10-CM | POA: Insufficient documentation

## 2020-12-06 DIAGNOSIS — M79641 Pain in right hand: Secondary | ICD-10-CM | POA: Diagnosis not present

## 2020-12-06 DIAGNOSIS — I059 Rheumatic mitral valve disease, unspecified: Secondary | ICD-10-CM | POA: Insufficient documentation

## 2020-12-06 DIAGNOSIS — L570 Actinic keratosis: Secondary | ICD-10-CM | POA: Insufficient documentation

## 2020-12-06 DIAGNOSIS — J8410 Pulmonary fibrosis, unspecified: Secondary | ICD-10-CM | POA: Insufficient documentation

## 2020-12-06 NOTE — Progress Notes (Signed)
Office Visit Note  Patient: Javier Phillips             Date of Birth: 11-12-1945           MRN: [id number]             PCP: Daisy Floro, MD Referring: Chilton Greathouse, MD Visit Date: 12/06/2020 Occupation: Retired special Ed Runner, broadcasting/film/video  Subjective:  New Patient (Initial Visit) (Patient complains of cough and SOB. Patient complains of right elbow pain. )   History of Present Illness: Javier Phillips is a 75 y.o. male here for evaluation of ILD with positive ANA with several months history of dyspnea and nonproductive cough. He saw Dr. Isaiah Serge for this problem with chest CT consistent with UIP and PFTs showing a FVC 59% predicted without significant obstruction or DLCO reduction. He feels overall well, although dry nonproductive cough is persistent since months ago. He has a history of pneumonia complication of influenza several years ago but had otherwise been in good health. He exercises regularly, and has noticed shoulder pain with resistance training but improved with resting from this activity. He denies any joint swelling and redness and is not stiff in the morning. He recently hit his finger while hiking and attributed some index finger swelling to this. He denies any skin rashes. He sometimes has trouble swallowing food and was referred for a swallowing study but this is not yet scheduled.  Labs reviewed 09/2020 ANA 1:640 speckled RF 22 CCP neg Scl-70 neg SSA neg SSB neg MyoMarker 3 - Mi-2 weakly pos ANCA neg CK 54 Aldolase 2.9  Imaging reviewed 10/2020 Spectrum of findings compatible with basilar predominant fibrotic interstitial lung disease with possible early honeycombing in the dependent lower lobes bilaterally. Findings are categorized as probable UIP per consensus guidelines: Diagnosis of Idiopathic Pulmonary Fibrosis: An Official ATS/ERS/JRS/ALAT Clinical Practice Guideline.  11/2020 PFT FVC 59% predicted FEV1 75% predicted FEV1/FVC 92% DLCO  88%  Activities of Daily Living:  Patient reports morning stiffness for 0 minutes.   Patient Denies nocturnal pain.  Difficulty dressing/grooming: Denies Difficulty climbing stairs: Denies Difficulty getting out of chair: Denies Difficulty using hands for taps, buttons, cutlery, and/or writing: Denies  Review of Systems  Constitutional:  Negative for fatigue.  HENT:  Negative for mouth sores, mouth dryness and nose dryness.   Eyes:  Positive for dryness. Negative for pain, itching and visual disturbance.  Respiratory:  Positive for cough and shortness of breath. Negative for hemoptysis and difficulty breathing.   Cardiovascular:  Negative for chest pain, palpitations and swelling in legs/feet.  Gastrointestinal:  Negative for abdominal pain, blood in stool, constipation and diarrhea.  Endocrine: Negative for increased urination.  Genitourinary:  Negative for painful urination.  Musculoskeletal:  Positive for joint pain and joint pain. Negative for joint swelling, myalgias, muscle weakness, morning stiffness, muscle tenderness and myalgias.  Skin:  Negative for color change, rash and redness.  Allergic/Immunologic: Negative for susceptible to infections.  Neurological:  Negative for dizziness, numbness, headaches, memory loss and weakness.  Hematological:  Negative for swollen glands.  Psychiatric/Behavioral:  Negative for confusion and sleep disturbance.    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   Left-sided sensorineural hearing loss 12/18/2016   Right chronic serous otitis media  12/18/2016   DOE (dyspnea on exertion) 05/24/2011   Thrombocytopenia (HCC) 05/24/2011    Past Medical History:  Diagnosis Date   Enlarged prostate    Heart murmur    Diagnosis of MVP years ago   Interstitial cystitis     Family History  Problem Relation Age of Onset   Stroke Mother    Dementia Father    Dementia Brother    Past Surgical History:  Procedure Laterality Date   HERNIA REPAIR     Social History   Social History Narrative   Not on file   Immunization History  Administered Date(s) Administered   Influenza Split 05/26/2011   Influenza, High Dose Seasonal PF 04/21/2018, 04/15/2019   Influenza,inj,Quad PF,6+ Mos 03/17/2017   Influenza,inj,quad, With Preservative 06/08/2015, 02/13/2016   Influenza-Unspecified 03/26/2012, 05/04/2013, 04/19/2014   PFIZER(Purple Top)SARS-COV-2 Vaccination 07/08/2019, 07/29/2019, 04/12/2020   Pneumococcal Conjugate-13 07/05/2014   Pneumococcal Polysaccharide-23 05/25/2009, 05/26/2011   Tdap 12/30/2011   Zoster, Live 05/23/2020     Objective: Vital Signs: BP 132/81 (BP Location: Right Arm, Patient Position: Sitting, Cuff Size: Normal)   Pulse 76   Ht 5\' 5"  (1.651 m)   Wt 137 lb 3.2 oz (62.2 kg)   BMI 22.83 kg/m    Physical Exam HENT:     Right Ear: External ear normal.     Left Ear: External ear normal.     Ears:     Comments: B/l hearing aids Cardiovascular:     Rate and Rhythm: Normal rate and regular rhythm.     Comments: Systolic murmur and probably split S2 heart sound Pulmonary:     Effort: Pulmonary effort is normal.     Comments: Bibasilar inspiratory crackles Skin:    General: Skin is warm and dry.     Findings: No rash.  Neurological:     General: No focal deficit present.     Mental Status: He is alert.  Psychiatric:        Mood and Affect: Mood normal.     Musculoskeletal Exam:  Neck full ROM no tenderness Shoulders full ROM no tenderness or swelling Elbows full ROM no tenderness or  swelling Wrists full ROM no tenderness or swelling Fingers full ROM no tenderness, mild swelling present at right 2nd MCP and PIP joints without redness or warmth Knees full ROM no tenderness or swelling Ankles full ROM no tenderness or swelling MTPs negative for squeeze tenderness  Investigation: No additional findings.  Imaging: XR Hand 2 View Left  Result Date: 12/06/2020 X-ray left hand 2 views Radiocarpal carpal joint spaces appear normal.  There is slight narrowing of second MCP and may be fifth MCP joint spaces.  PIPs are well-preserved degenerative change in DIP joints with worse joint space loss in the fifth digit and subchondral cyst in second digit.  No erosions are seen.  Bone mineralization appears normal. Impression Mild degenerative changes at DIP joints and few MCPs  XR Hand 2 View Right  Result Date: 12/06/2020 X-ray right hand 2 views Radiocarpal carpal joint space appears normal.  There is some narrowing at the second MCP joint others MCPs appear normal.  PIP joints are mostly preserved there is degenerative change in DIP joints worst in fourth and fifth digits joint space loss.  No erosions are seen.  Bone mineralization appears normal. 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  Procedures:  No procedures performed Allergies: Aspirin and Nsaids   Assessment / Plan:     Visit Diagnoses:  Positive ANA (antinuclear antibody) Rheumatoid factor positive - Plan: XR Hand 2 View Right, XR Hand 2 View Left, XR Hand 2 View Right, XR Hand 2 View Left, Centromere Antibodies, RNP Antibody, Anti-DNA antibody, double-stranded, Anti-Smith antibody  New diagnosis of interstitial lung disease with positive ANA and RF autoantibody test.  He does not describe specific clinical features for systemic autoimmune disease none seen on physical exam today.  We will check additional specific serologies but low pretest suspicion.    Finger swelling - Plan: XR Hand 2 View Right, XR Hand 2 View Left  We will also check bilateral hand x-rays for any evidence of inflammatory arthritis considering some hand swelling with these antibody profile.  He did have a minor trauma preceding this visit which could be an alternate explanation.  Orders: Orders Placed This Encounter  Procedures   XR Hand 2 View Right   XR Hand 2 View Left   Centromere Antibodies   RNP Antibody   Anti-DNA antibody, double-stranded   Anti-Smith antibody    No orders of the defined types were placed in this encounter.    Follow-Up Instructions: No follow-ups on file.   Fuller Plan, MD  Note - This record has been created using [employer].  Chart creation errors have been sought, but may not always  have been located. Such creation errors do not reflect on  the standard of medical care.

## 2020-12-07 LAB — ANTI-DNA ANTIBODY, DOUBLE-STRANDED: ds DNA Ab: <1 IU/mL

## 2020-12-07 LAB — RNP ANTIBODY: Ribonucleic Protein(ENA) Antibody, IgG: <1 AI

## 2020-12-07 LAB — ANTI-SMITH ANTIBODY: ENA SM Ab Ser-aCnc: <1 AI

## 2020-12-07 LAB — CENTROMERE ANTIBODIES: Centromere Ab Screen: <1 AI

## 2020-12-21 ENCOUNTER — Other Ambulatory Visit (HOSPITAL_COMMUNITY): Payer: Self-pay | Admitting: *Deleted

## 2020-12-21 ENCOUNTER — Other Ambulatory Visit: Payer: Self-pay | Admitting: *Deleted

## 2020-12-21 DIAGNOSIS — R059 Cough, unspecified: Secondary | ICD-10-CM

## 2020-12-21 DIAGNOSIS — J849 Interstitial pulmonary disease, unspecified: Secondary | ICD-10-CM

## 2020-12-21 DIAGNOSIS — R1310 Dysphagia, unspecified: Secondary | ICD-10-CM

## 2020-12-28 ENCOUNTER — Other Ambulatory Visit: Payer: Self-pay

## 2020-12-28 ENCOUNTER — Ambulatory Visit (HOSPITAL_COMMUNITY)
Admission: RE | Admit: 2020-12-28 | Discharge: 2020-12-28 | Disposition: A | Discharge status: Home / Self Care | Payer: Medicare PPO | Source: Ambulatory Visit | Attending: Pulmonary Disease | Admitting: Pulmonary Disease

## 2020-12-28 DIAGNOSIS — J849 Interstitial pulmonary disease, unspecified: Secondary | ICD-10-CM

## 2020-12-28 DIAGNOSIS — R1310 Dysphagia, unspecified: Secondary | ICD-10-CM | POA: Insufficient documentation

## 2020-12-28 DIAGNOSIS — R059 Cough, unspecified: Secondary | ICD-10-CM

## 2020-12-28 NOTE — Progress Notes (Signed)
Modified Barium Swallow Progress Note  Patient Details  Name: Javier Phillips MRN: [id number] Date of Birth: 02/19/1946  Today's Date: 12/28/2020  Modified Barium Swallow completed.  Full report located under Chart Review in the Imaging Section.  Brief recommendations include the following:  Clinical Impression  Pt demonstrates functional oropharynageal swallow ability without aspiration and normal flash penetration (did not remain in laryngeal vestibule). Mild anatomical changes/differences in cervical spine appreciated with a more lordodic appearance. Hypopharyngeal space is wider although epiglottis is able to fully protect, several swallows epiglottis was horizontically inverted during swallow versus vertical. He had min-mild vallecular and pyriform sinus retention when volume was smaller and swallow shorter in duration. Placed pill in oral cavity which fell to vallecuale prior to taking sip. It sid transit cervical esophagus without difficulty. Presently his swallow is within functional limits. If cervical vertebrae changes and it could pose difficulty if he became decompensated with an significant illness. Reccommed continue regular texture, thin liquids.   Swallow Evaluation Recommendations       SLP Diet Recommendations: Regular solids;Thin liquid   Liquid Administration via: Straw;Cup   Medication Administration: Whole meds with liquid   Supervision: Patient able to self feed   Compensations: Slow rate;Small sips/bites   Postural Changes: Seated upright at 90 degrees;Remain semi-upright after after feeds/meals (Comment)   Oral Care Recommendations: Oral care BID        Royce Macadamia 12/28/2020,6:50 PM  Breck Coons Lonell Face.Ed Nurse, children's [phone] Office [phone]

## 2021-01-08 ENCOUNTER — Encounter: Payer: Self-pay | Admitting: Pulmonary Disease

## 2021-01-08 ENCOUNTER — Other Ambulatory Visit: Payer: Self-pay

## 2021-01-08 ENCOUNTER — Encounter (HOSPITAL_COMMUNITY): Payer: Self-pay | Admitting: *Deleted

## 2021-01-08 ENCOUNTER — Ambulatory Visit: Payer: Medicare PPO | Admitting: Pulmonary Disease

## 2021-01-08 VITALS — BP 118/70 | HR 69 | Temp 97.3°F | Ht 65.0 in | Wt 130.2 lb

## 2021-01-08 DIAGNOSIS — J849 Interstitial pulmonary disease, unspecified: Secondary | ICD-10-CM | POA: Diagnosis not present

## 2021-01-08 NOTE — Addendum Note (Signed)
Addended bySandra Cockayne on: 01/08/2021 11:35 AM   Modules accepted: Orders

## 2021-01-08 NOTE — Addendum Note (Signed)
Addended bySandra Cockayne on: 01/08/2021 10:58 AM   Modules accepted: Orders

## 2021-01-08 NOTE — Patient Instructions (Signed)
I am glad you are doing well with your breathing I have reviewed the findings of scarring in your lung and the condition called IPF which is likely progressive over time We have discussed available therapies including Ofev and Esbriet  Please discuss this further at home with your wife and let me know if you want to start therapy In the meantime we will order a follow-up high-resolution CT and pulmonary function test in 6 months and follow-up in clinic after these tests

## 2021-01-08 NOTE — Progress Notes (Addendum)
Javier Phillips    [id number]    02-19-1946  Primary Care Physician:Ross, Darlen Round, MD  Referring Physician: Daisy Floro, MD [address] [place],  [place] [address]  Chief complaint: Follow-up for interstitial lung disease  HPI: 75 year old with history of mitral valve prolapse, GERD Complains of increasing dyspnea on exertion for the past 4 months.  This is associated with a dry hacking cough.  He is on albuterol and cough drops which help.  Cough is exacerbated by exposure to cigarette smoke, freshly cut grass  Stays active with exercise at [facility], swimming but this has been limited somewhat recently Has followed with Dr. Antoine Poche for mitral valve prolapse  Denies any joint pain, rash.  He has dry eyes.  Occasional dysphagia with choking on dry crackers.   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  Wife uses a down pillow.   ILD questionnaire 11/27/20-negative except for above Smoking history: Never smoker Travel history: No significant travel history Relevant family history: Has family history of asthma and emphysema  Interim history:  Has seen Dr. Dimple Casey for elevated ANA.  Findings are not felt to be secondary to connective tissue disease.  Additional serologic tests were ordered which are negative  He has also seen speech pathology for modified barium swallow with findings of mild aspiration risk but no actual witnessed aspiration  Outpatient Encounter Medications as of 01/08/2021  Medication Sig   acetaminophen (TYLENOL) 325 MG tablet Take 650 mg by mouth every 6 (six) hours as needed. For fever   Albuterol Sulfate (PROAIR RESPICLICK) 108 (90 Base) MCG/ACT AEPB    amitriptyline (ELAVIL) 10 MG tablet 1 tablet at bedtime.   cetirizine (ZYRTEC) 10 MG tablet Take 10 mg by mouth daily.   famotidine (PEPCID) 20 MG tablet Take 20 mg by mouth  daily as needed.   fluticasone (FLONASE) 50 MCG/ACT nasal spray Place into both nostrils daily as needed.   oxybutynin (DITROPAN-XL) 10 MG 24 hr tablet Take 10 mg by mouth daily.   Probiotic Product (PROBIOTIC PO) Take by mouth daily.   Tamsulosin HCl (FLOMAX) 0.4 MG CAPS Take 0.4 mg by mouth daily.   No facility-administered encounter medications on file as of 01/08/2021.   Physical Exam: Blood pressure 118/70, pulse 69, temperature (!) 97.3 F (36.3 C), temperature source Oral, height 5\' 5"  (1.651 m), weight 130 lb 3.2 oz (59.1 kg), SpO2 97 %. Gen:      No acute distress HEENT:  EOMI, sclera anicteric Neck:     No masses; no thyromegaly Lungs:    Bibasal crackles CV:         Regular rate and rhythm; no murmurs Abd:      + bowel sounds; soft, non-tender; no palpable masses, no distension Ext:    No edema; adequate peripheral perfusion Skin:      Warm and dry; no rash Neuro: alert and oriented x 3 Psych: normal mood and affect   Data Reviewed: Imaging: Chest x-ray 09/10/2020- interstitial changes at the lung base suggestive of pulmonary fibrosis.   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  We had a detailed discussion in office today with him and wife about antifibrotic therapy, risks and benefits.  He wants to think more about it and let me know In the meantime I will order a high-resolution CT and PFTs follow-up in 6 months Referral to pulmonary rehab  Plan/Recommendations: High-res CT, PFTs in 6 months Pulmonary rehab  Chilton Greathouse MD [facility] 01/08/2021, 10:34 AM  CC: Daisy Floro, MD

## 2021-01-08 NOTE — Progress Notes (Signed)
Received referral from Dr. Isaiah Serge for this pt to participate in pulmonary rehab with the the diagnosis of ILD. Clinical review of pt follow up appt on 7/26 Pulmonary office note.  Pt with Covid Risk Score - 3. Pt appropriate for scheduling for Pulmonary rehab.  Will forward to support staff for scheduling when able as there is a wait list and verification of insurance eligibility/benefits with pt consent. Alanson Aly, BSN Cardiac and Emergency planning/management officer

## 2021-02-20 ENCOUNTER — Telehealth (HOSPITAL_COMMUNITY): Payer: Self-pay

## 2021-02-20 NOTE — Telephone Encounter (Signed)
Pt insurance is active and benefits verified through [facility]. Co-pay $20.00, DED $0.00/$0.00 met, out of pocket $4,000.00/$340.00 met, co-insurance 0%. No pre-authorization required. Leah/[employer], 02/20/21 @ 11:44AM, REF# [id number]   Will contact patient to see if he is interested in the Pulmonary Rehab Program.

## 2021-02-20 NOTE — Telephone Encounter (Signed)
Called patient to see if he is interested in the Pulmonary Rehab Program. Patient expressed interest. Explained scheduling process and went over insurance, patient verbalized understanding. Also adv pt where we are with scheduling for PR and that we have a back log. (1-3 months) 

## 2021-03-07 DIAGNOSIS — R011 Cardiac murmur, unspecified: Secondary | ICD-10-CM | POA: Insufficient documentation

## 2021-03-07 NOTE — Progress Notes (Signed)
Cardiology Office Note   Date:  03/08/2021   ID:  Daivon, Rayos [birth year], MRN [id number]  PCP:  Daisy Floro, MD  Cardiologist:   None Referring:  Daisy Floro, MD  Chief Complaint  Patient presents with   Shortness of Breath    At times.       History of Present Illness: Javier Phillips is a 75 y.o. male who is referred by Daisy Floro, MD for evaluation of DOE.  He had a murmur and I sent him for an echo and he had aortic sclerosis.  He has been diagnosed with pulmonary fibrosis.    He actually thinks his breathing is better than it was before.  He is exercising 40 minutes a day at the [facility].  He says he gets a little short of breath moving quickly up a flight of stairs but he recovers quickly.  He is not describing any resting shortness of breath, PND or orthopnea.  He denies any chest pressure, palpitations, presyncope or syncope.   Past Medical History:  Diagnosis Date   Enlarged prostate    Heart murmur    Diagnosis of MVP years ago   Interstitial cystitis     Past Surgical History:  Procedure Laterality Date   HERNIA REPAIR       Current Outpatient Medications  Medication Sig Dispense Refill   acetaminophen (TYLENOL) 325 MG tablet Take 650 mg by mouth every 6 (six) hours as needed. For fever     Albuterol Sulfate (PROAIR RESPICLICK) 108 (90 Base) MCG/ACT AEPB      amitriptyline (ELAVIL) 10 MG tablet 1 tablet at bedtime.     cetirizine (ZYRTEC) 10 MG tablet Take 10 mg by mouth daily.     famotidine (PEPCID) 20 MG tablet Take 20 mg by mouth daily as needed.     fluticasone (FLONASE) 50 MCG/ACT nasal spray Place into both nostrils daily as needed.     oxybutynin (DITROPAN-XL) 10 MG 24 hr tablet Take 10 mg by mouth daily.     Probiotic Product (PROBIOTIC PO) Take by mouth daily.     Tamsulosin HCl (FLOMAX) 0.4 MG CAPS Take 0.4 mg by mouth daily.     No current facility-administered medications for this visit.    Allergies:    Aspirin and Nsaids   ROS:  Please see the history of present illness.   Otherwise, review of systems are positive for none.   All other systems are reviewed and negative.    PHYSICAL EXAM: VS:  BP 122/62 (BP Location: Left Arm, Patient Position: Sitting, Cuff Size: Normal)   Pulse 72   Ht 5\' 5"  (1.651 m)   Wt 130 lb (59 kg)   BMI 21.63 kg/m  , BMI Body mass index is 21.63 kg/m. GENERAL:  Well appearing NECK:  No jugular venous distention, waveform within normal limits, carotid upstroke brisk and symmetric, no bruits, no thyromegaly LUNGS:  Clear to auscultation bilaterally CHEST: Bilateral dry crackles one third of the way up HEART:  PMI not displaced or sustained,S1 and S2 within normal limits, no S3, no S4, no clicks, no rubs, 2 out of 6 brief apical systolic murmur nonradiating, no diastolic murmurs ABD:  Flat, positive bowel sounds normal in frequency in pitch, no bruits, no rebound, no guarding, no midline pulsatile mass, no hepatomegaly, no splenomegaly EXT:  2 plus pulses throughout, no edema, no cyanosis no clubbing    EKG:  EKG is not ordered today.  Recent Labs: No results found for requested labs within last 8760 hours.    Lipid Panel No results found for: CHOL, TRIG, HDL, CHOLHDL, VLDL, LDLCALC, LDLDIRECT    Wt Readings from Last 3 Encounters:  03/08/21 130 lb (59 kg)  01/08/21 130 lb 3.2 oz (59.1 kg)  12/06/20 137 lb 3.2 oz (62.2 kg)      Other studies Reviewed: Additional studies/ records that were reviewed today include:   Pulmonary notes Review of the above records demonstrates:  Please see elsewhere in the note.     ASSESSMENT AND PLAN:  DOE:  He has UIP pattern pulmonary fibrosis.    No further work-up is suggested.  BNP was normal.  I would not suggest a cardiac etiology.   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  No further work-up.    Current medicines are reviewed at length with the patient today.  The patient does not have  concerns regarding medicines.  The following changes have been made:  None  Labs/ tests ordered today include: None  No orders of the defined types were placed in this encounter.    Disposition:   FU with me as needed  Signed, Rollene Rotunda, MD  03/08/2021 9:21 AM    [facility]

## 2021-03-08 ENCOUNTER — Other Ambulatory Visit: Payer: Self-pay

## 2021-03-08 ENCOUNTER — Ambulatory Visit: Payer: Medicare PPO | Admitting: Cardiology

## 2021-03-08 ENCOUNTER — Encounter: Payer: Self-pay | Admitting: Cardiology

## 2021-03-08 VITALS — BP 122/62 | HR 72 | Ht 65.0 in | Wt 130.0 lb

## 2021-03-08 DIAGNOSIS — R011 Cardiac murmur, unspecified: Secondary | ICD-10-CM | POA: Diagnosis not present

## 2021-03-08 DIAGNOSIS — R0609 Other forms of dyspnea: Secondary | ICD-10-CM

## 2021-03-08 DIAGNOSIS — R0600 Dyspnea, unspecified: Secondary | ICD-10-CM | POA: Diagnosis not present

## 2021-03-08 NOTE — Patient Instructions (Signed)
Medication Instructions:  ?Your physician recommends that you continue on your current medications as directed. Please refer to the Current Medication list given to you today.  ? ?Labwork: ?NONE ? ?Testing/Procedures: ?NONE ? ?Follow-Up: ?AS NEEDED  ? ?  ?

## 2021-03-12 DIAGNOSIS — H524 Presbyopia: Secondary | ICD-10-CM | POA: Diagnosis not present

## 2021-03-12 DIAGNOSIS — H2513 Age-related nuclear cataract, bilateral: Secondary | ICD-10-CM | POA: Diagnosis not present

## 2021-04-17 ENCOUNTER — Telehealth: Payer: Self-pay | Admitting: Pulmonary Disease

## 2021-04-17 MED ORDER — AZITHROMYCIN 250 MG PO TABS: ORAL_TABLET | ORAL | 0 refills | Status: DC

## 2021-04-17 MED ORDER — PREDNISONE 20 MG PO TABS: 40.0000 mg | ORAL_TABLET | Freq: Every day | ORAL | 0 refills | Status: DC

## 2021-04-17 NOTE — Telephone Encounter (Signed)
Called and spoke with patient. He verbalized understanding and stated that he will arrange for a covid test. RXs have been sent to the pharmacy.   Nothing further needed at time of call.

## 2021-04-17 NOTE — Telephone Encounter (Signed)
I would advise him to get COVID tested Please also send in a Z-Pak and prednisone 40 mg a day for 5 days Call back for chest x-ray if symptoms are persistent

## 2021-04-17 NOTE — Telephone Encounter (Signed)
Called and spoke with pt who states he started coughing overnight 11/1 which he states his cough is a persistent cough and forceful. Pt said that he is not coughing up any mucus.  Checked temp and temp was 98.7. Pt said that he has been having some aches and chills.  Pt has taken tylenol to help with the aches and chills, has been using his albuterol inhaler every 4 hours due to his cough, and also has been using Ricola cough drops which he said has helped with his cough too.  Pt has not taken a covid test and said that he does not have anymore at home either.  Pt wants to know what we recommend to help with his symptoms. Dr. Isaiah Serge, please advise.

## 2021-04-19 ENCOUNTER — Telehealth (HOSPITAL_COMMUNITY): Payer: Self-pay

## 2021-04-19 ENCOUNTER — Encounter (HOSPITAL_COMMUNITY): Payer: Self-pay

## 2021-04-19 ENCOUNTER — Telehealth: Payer: Self-pay | Admitting: Pulmonary Disease

## 2021-04-19 MED ORDER — PREDNISONE 20 MG PO TABS: 40.0000 mg | ORAL_TABLET | Freq: Every day | ORAL | 0 refills | Status: AC

## 2021-04-19 NOTE — Telephone Encounter (Signed)
I am glad that he is feeling better Please extend prednisone 40 mg a day for 5 more days

## 2021-04-19 NOTE — Telephone Encounter (Signed)
I have called and spoke with pt and he is aware of PM recs.  The prednisone has been sent to the pharmacy and pt is aware. Nothing further is needed.

## 2021-04-19 NOTE — Telephone Encounter (Signed)
Please see 04/17/2021 phone note.   Patient stated that he had a negative covid test on 04/17/2021. Patient was prescribed zpak and prednisone. He had three days left on both prednisone and zpak. He has had some relief in sx but sx have not completely subsided.  C/o non prod cough and wheezing Cough is much improved but is still present at night Last fever 04/18/2021. Sob is baseline.  He wanted to be sure that Dr. Isaiah Serge didn't want to add anything before the weekend.   Dr. Isaiah Serge, please advise. thanks

## 2021-04-19 NOTE — Telephone Encounter (Signed)
Attempted to call patient in regards to Pulmonary Rehab - LM on VM Mailed letter 

## 2021-04-25 ENCOUNTER — Telehealth (HOSPITAL_COMMUNITY): Payer: Self-pay | Admitting: *Deleted

## 2021-04-29 ENCOUNTER — Encounter (HOSPITAL_COMMUNITY): Payer: Self-pay

## 2021-04-29 ENCOUNTER — Other Ambulatory Visit: Payer: Self-pay

## 2021-04-29 ENCOUNTER — Encounter (HOSPITAL_COMMUNITY)
Admission: RE | Admit: 2021-04-29 | Discharge: 2021-04-29 | Disposition: A | Discharge status: Home / Self Care | Payer: Medicare PPO | Source: Ambulatory Visit | Attending: Pulmonary Disease | Admitting: Pulmonary Disease

## 2021-04-29 VITALS — BP 104/56 | HR 79 | Ht 65.5 in | Wt 133.6 lb

## 2021-04-29 DIAGNOSIS — J849 Interstitial pulmonary disease, unspecified: Secondary | ICD-10-CM | POA: Insufficient documentation

## 2021-04-29 HISTORY — DX: Interstitial pulmonary disease, unspecified: J84.9

## 2021-04-29 NOTE — Progress Notes (Signed)
Pulmonary Individual Treatment Plan  Patient Details  Name: Javier Phillips MRN: [id number] Date of Birth: [birth year] Referring Provider:   Doristine Devoid Pulmonary Rehab Walk Test from 04/29/2021 in [facility]  Referring Provider Mannam       Initial Encounter Date:  Flowsheet Row Pulmonary Rehab Walk Test from 04/29/2021 in [facility] CARDIAC REHAB  Date 04/29/21       Visit Diagnosis: Interstitial lung disease (HCC)  Patient's Home Medications on Admission:   Current Outpatient Medications:    acetaminophen (TYLENOL) 325 MG tablet, Take 650 mg by mouth every 6 (six) hours as needed. For fever, Disp: , Rfl:    Albuterol Sulfate (PROAIR RESPICLICK) 108 (90 Base) MCG/ACT AEPB, , Disp: , Rfl:    amitriptyline (ELAVIL) 10 MG tablet, 1 tablet at bedtime., Disp: , Rfl:    azithromycin (ZITHROMAX) 250 MG tablet, Take 2 tablets on first day, then 1 tablet daily until finished, Disp: 6 tablet, Rfl: 0   cetirizine (ZYRTEC) 10 MG tablet, Take 10 mg by mouth daily., Disp: , Rfl:    famotidine (PEPCID) 20 MG tablet, Take 20 mg by mouth daily as needed., Disp: , Rfl:    fluticasone (FLONASE) 50 MCG/ACT nasal spray, Place into both nostrils daily as needed., Disp: , Rfl:    oxybutynin (DITROPAN-XL) 10 MG 24 hr tablet, Take 10 mg by mouth daily., Disp: , Rfl:    Probiotic Product (PROBIOTIC PO), Take by mouth daily., Disp: , Rfl:    Tamsulosin HCl (FLOMAX) 0.4 MG CAPS, Take 0.4 mg by mouth daily., Disp: , Rfl:   Past Medical History: Past Medical History:  Diagnosis Date   Enlarged prostate    Heart murmur    Diagnosis of MVP years ago   Interstitial cystitis    Interstitial lung disease (HCC)     Tobacco Use: Social History   Tobacco Use  Smoking Status Never  Smokeless Tobacco Never    Labs: Recent Review Flowsheet Data   There is no flowsheet data to display.     Capillary Blood Glucose: No results found for:  GLUCAP   Pulmonary Assessment Scores:  Pulmonary Assessment Scores     Row Name 04/29/21 0942         ADL UCSD   ADL Phase Entry     SOB Score total 24       CAT Score   CAT Score 7             UCSD: Self-administered rating of dyspnea associated with activities of daily living (ADLs) 6-point scale (0 = "not at all" to 5 = "maximal or unable to do because of breathlessness")  Scoring Scores range from 0 to 120.  Minimally important difference is 5 units  CAT: CAT can identify the health impairment of COPD patients and is better correlated with disease progression.  CAT has a scoring range of zero to 40. The CAT score is classified into four groups of low (less than 10), medium (10 - 20), high (21-30) and very high (31-40) based on the impact level of disease on health status. A CAT score over 10 suggests significant symptoms.  A worsening CAT score could be explained by an exacerbation, poor medication adherence, poor inhaler technique, or progression of COPD or comorbid conditions.  CAT MCID is 2 points  mMRC: mMRC (Modified Medical Research Council) Dyspnea Scale is used to assess the degree of baseline functional disability in patients of respiratory disease due  to dyspnea. No minimal important difference is established. A decrease in score of 1 point or greater is considered a positive change.   Pulmonary Function Assessment:  Pulmonary Function Assessment - 04/29/21 0937       Breath   Bilateral Breath Sounds Clear   crackles 1/2 up on right and 1/3 up on left   Shortness of Breath No             Exercise Target Goals: Exercise Program Goal: Individual exercise prescription set using results from initial 6 min walk test and THRR while considering  patient's activity barriers and safety.   Exercise Prescription Goal: Initial exercise prescription builds to 30-45 minutes a day of aerobic activity, 2-3 days per week.  Home exercise guidelines will be given to  patient during program as part of exercise prescription that the participant will acknowledge.  Activity Barriers & Risk Stratification:  Activity Barriers & Cardiac Risk Stratification - 04/29/21 0935       Activity Barriers & Cardiac Risk Stratification   Activity Barriers Arthritis;Deconditioning;Muscular Weakness;Shortness of Breath             6 Minute Walk:  6 Minute Walk     Row Name 04/29/21 1028         6 Minute Walk   Phase Initial     Distance 1530 feet     Walk Time 6 minutes     # of Rest Breaks 0     MPH 2.9     METS 3.15     RPE 11     Perceived Dyspnea  1     VO2 Peak 11.01     Symptoms No     Resting HR 79 bpm     Resting BP 104/56     Resting Oxygen Saturation  98 %     Exercise Oxygen Saturation  during 6 min walk 94 %     Max Ex. HR 99 bpm     Max Ex. BP 110/60     2 Minute Post BP 102/60       Interval HR   1 Minute HR 76     2 Minute HR 76     3 Minute HR 86     4 Minute HR 89     5 Minute HR 91     6 Minute HR 99     2 Minute Post HR 69     Interval Heart Rate? Yes       Interval Oxygen   Interval Oxygen? Yes     Baseline Oxygen Saturation % 98 %     1 Minute Oxygen Saturation % 97 %     1 Minute Liters of Oxygen 0 L     2 Minute Oxygen Saturation % 85 %  Cold finger     2 Minute Liters of Oxygen 0 L     3 Minute Oxygen Saturation % 96 %     3 Minute Liters of Oxygen 0 L     4 Minute Oxygen Saturation % 95 %     4 Minute Liters of Oxygen 0 L     5 Minute Oxygen Saturation % 96 %     5 Minute Liters of Oxygen 0 L     6 Minute Oxygen Saturation % 94 %     6 Minute Liters of Oxygen 0 L     2 Minute Post Oxygen Saturation % 99 %     2  Minute Post Liters of Oxygen 0 L              Oxygen Initial Assessment:  Oxygen Initial Assessment - 04/29/21 0936       Home Oxygen   Home Oxygen Device None    Sleep Oxygen Prescription None    Home Exercise Oxygen Prescription None    Home Resting Oxygen Prescription None              Oxygen Re-Evaluation:   Oxygen Discharge (Final Oxygen Re-Evaluation):   Initial Exercise Prescription:  Initial Exercise Prescription - 04/29/21 1000       Date of Initial Exercise RX and Referring Provider   Date 04/29/21    Referring Provider Mannam    Expected Discharge Date 07/11/21      Treadmill   MPH 1.8    Grade 1    Minutes 15      Recumbant Bike   Level 2    Watts 22    Minutes 15      Prescription Details   Frequency (times per week) 2    Duration Progress to 30 minutes of continuous aerobic without signs/symptoms of physical distress      Intensity   THRR 40-80% of Max Heartrate 58-116    Ratings of Perceived Exertion 11-13      Progression   Progression Continue to progress workloads to maintain intensity without signs/symptoms of physical distress.      Resistance Training   Training Prescription Yes    Weight Blue bands    Reps 10-15             Perform Capillary Blood Glucose checks as needed.  Exercise Prescription Changes:   Exercise Comments:   Exercise Goals and Review:   Exercise Goals     Row Name 04/29/21 1033             Exercise Goals   Increase Physical Activity Yes       Intervention Provide advice, education, support and counseling about physical activity/exercise needs.;Develop an individualized exercise prescription for aerobic and resistive training based on initial evaluation findings, risk stratification, comorbidities and participant's personal goals.       Expected Outcomes Short Term: Attend rehab on a regular basis to increase amount of physical activity.;Long Term: Add in home exercise to make exercise part of routine and to increase amount of physical activity.;Long Term: Exercising regularly at least 3-5 days a week.       Increase Strength and Stamina Yes       Intervention Provide advice, education, support and counseling about physical activity/exercise needs.;Develop an individualized  exercise prescription for aerobic and resistive training based on initial evaluation findings, risk stratification, comorbidities and participant's personal goals.       Expected Outcomes Short Term: Increase workloads from initial exercise prescription for resistance, speed, and METs.;Short Term: Perform resistance training exercises routinely during rehab and add in resistance training at home;Long Term: Improve cardiorespiratory fitness, muscular endurance and strength as measured by increased METs and functional capacity ( )       Able to understand and use rate of perceived exertion (RPE) scale Yes       Intervention Provide education and explanation on how to use RPE scale       Expected Outcomes Short Term: Able to use RPE daily in rehab to express subjective intensity level;Long Term:  Able to use RPE to guide intensity level when exercising independently  Able to understand and use Dyspnea scale Yes       Intervention Provide education and explanation on how to use Dyspnea scale       Expected Outcomes Short Term: Able to use Dyspnea scale daily in rehab to express subjective sense of shortness of breath during exertion;Long Term: Able to use Dyspnea scale to guide intensity level when exercising independently       Knowledge and understanding of Target Heart Rate Range (THRR) Yes       Intervention Provide education and explanation of THRR including how the numbers were predicted and where they are located for reference       Expected Outcomes Short Term: Able to state/look up THRR;Short Term: Able to use daily as guideline for intensity in rehab;Long Term: Able to use THRR to govern intensity when exercising independently       Expected Outcomes --       Understanding of Exercise Prescription Yes       Intervention Provide education, explanation, and written materials on patient's individual exercise prescription       Expected Outcomes Short Term: Able to explain program exercise  prescription;Long Term: Able to explain home exercise prescription to exercise independently                Exercise Goals Re-Evaluation :   Discharge Exercise Prescription (Final Exercise Prescription Changes):   Nutrition:  Target Goals: Understanding of nutrition guidelines, daily intake of sodium 1500mg , cholesterol 200mg , calories 30% from fat and 7% or less from saturated fats, daily to have 5 or more servings of fruits and vegetables.  Biometrics:    Nutrition Therapy Plan and Nutrition Goals:   Nutrition Assessments:  MEDIFICTS Score Key: ?70 Need to make dietary changes  40-70 Heart Healthy Diet ? 40 Therapeutic Level Cholesterol Diet   Picture Your Plate Scores: <94 Unhealthy dietary pattern with much room for improvement. 41-50 Dietary pattern unlikely to meet recommendations for good health and room for improvement. 51-60 More healthful dietary pattern, with some room for improvement.  >60 Healthy dietary pattern, although there may be some specific behaviors that could be improved.    Nutrition Goals Re-Evaluation:   Nutrition Goals Discharge (Final Nutrition Goals Re-Evaluation):   Psychosocial: Target Goals: Acknowledge presence or absence of significant depression and/or stress, maximize coping skills, provide positive support system. Participant is able to verbalize types and ability to use techniques and skills needed for reducing stress and depression.  Initial Review & Psychosocial Screening:  Initial Psych Review & Screening - 04/29/21 0944       Initial Review   Current issues with None Identified      Family Dynamics   Good Support System? Yes   wife   Comments No concerns identified at this time      Barriers   Psychosocial barriers to participate in program There are no identifiable barriers or psychosocial needs.      Screening Interventions   Interventions Encouraged to exercise             Quality of Life  Scores:  Scores of 19 and below usually indicate a poorer quality of life in these areas.  A difference of  2-3 points is a clinically meaningful difference.  A difference of 2-3 points in the total score of the Quality of Life Index has been associated with significant improvement in overall quality of life, self-image, physical symptoms, and general health in studies assessing change in quality of life.  PHQ-9: Recent Review Flowsheet Data     Depression screen [facility] 2/9 04/29/2021 04/29/2021   Decreased Interest 0 0   Down, Depressed, Hopeless 0 0   PHQ - 2 Score 0 0   Altered sleeping 0 -   Tired, decreased energy 0 -   Change in appetite 0 -   Feeling bad or failure about yourself  0 -   Trouble concentrating 0 -   Moving slowly or fidgety/restless 0 -   Suicidal thoughts 0 -   PHQ-9 Score 0 -   Difficult doing work/chores Not difficult at all -      Interpretation of Total Score  Total Score Depression Severity:  1-4 = Minimal depression, 5-9 = Mild depression, 10-14 = Moderate depression, 15-19 = Moderately severe depression, 20-27 = Severe depression   Psychosocial Evaluation and Intervention:  Psychosocial Evaluation - 04/29/21 0947       Psychosocial Evaluation & Interventions   Interventions Encouraged to exercise with the program and follow exercise prescription    Comments No concerns identified    Expected Outcomes To continue to be free of psychosocial concerns while participating in pulmonary rehab.    Continue Psychosocial Services  No Follow up required             Psychosocial Re-Evaluation:   Psychosocial Discharge (Final Psychosocial Re-Evaluation):   Education: Education Goals: Education classes will be provided on a weekly basis, covering required topics. Participant will state understanding/return demonstration of topics presented.  Learning Barriers/Preferences:  Learning Barriers/Preferences - 04/29/21 1002       Learning  Barriers/Preferences   Learning Barriers None    Learning Preferences Computer/Internet;Group Instruction;Individual Instruction;Pictoral;Skilled Demonstration;Verbal Instruction;Video;Written Material             Education Topics: Risk Factor Reduction:  -Group instruction that is supported by a PowerPoint presentation. Instructor discusses the definition of a risk factor, different risk factors for pulmonary disease, and how the heart and lungs work together.     Nutrition for Pulmonary Patient:  -Group instruction provided by PowerPoint slides, verbal discussion, and written materials to support subject matter. The instructor gives an explanation and review of healthy diet recommendations, which includes a discussion on weight management, recommendations for fruit and vegetable consumption, as well as protein, fluid, caffeine, fiber, sodium, sugar, and alcohol. Tips for eating when patients are short of breath are discussed.   Pursed Lip Breathing:  -Group instruction that is supported by demonstration and informational handouts. Instructor discusses the benefits of pursed lip and diaphragmatic breathing and detailed demonstration on how to preform both.     Oxygen Safety:  -Group instruction provided by PowerPoint, verbal discussion, and written material to support subject matter. There is an overview of "What is Oxygen" and "Why do we need it".  Instructor also reviews how to create a safe environment for oxygen use, the importance of using oxygen as prescribed, and the risks of noncompliance. There is a brief discussion on traveling with oxygen and resources the patient may utilize.   Oxygen Equipment:  -Group instruction provided by [facility] Staff utilizing handouts, written materials, and equipment demonstrations.   Signs and Symptoms:  -Group instruction provided by written material and verbal discussion to support subject matter. Warning signs and symptoms of infection,  stroke, and heart attack are reviewed and when to call the physician/911 reinforced. Tips for preventing the spread of infection discussed.   Advanced Directives:  -Group instruction provided by verbal instruction and written material to support  subject matter. Instructor reviews Advanced Directive laws and proper instruction for filling out document.   Pulmonary Video:  -Group video education that reviews the importance of medication and oxygen compliance, exercise, good nutrition, pulmonary hygiene, and pursed lip and diaphragmatic breathing for the pulmonary patient.   Exercise for the Pulmonary Patient:  -Group instruction that is supported by a PowerPoint presentation. Instructor discusses benefits of exercise, core components of exercise, frequency, duration, and intensity of an exercise routine, importance of utilizing pulse oximetry during exercise, safety while exercising, and options of places to exercise outside of rehab.     Pulmonary Medications:  -Verbally interactive group education provided by instructor with focus on inhaled medications and proper administration.   Anatomy and Physiology of the Respiratory System and Intimacy:  -Group instruction provided by PowerPoint, verbal discussion, and written material to support subject matter. Instructor reviews respiratory cycle and anatomical components of the respiratory system and their functions. Instructor also reviews differences in obstructive and restrictive respiratory diseases with examples of each. Intimacy, Sex, and Sexuality differences are reviewed with a discussion on how relationships can change when diagnosed with pulmonary disease. Common sexual concerns are reviewed.   MD DAY -A group question and answer session with a medical doctor that allows participants to ask questions that relate to their pulmonary disease state.   OTHER EDUCATION -Group or individual verbal, written, or video instructions that support  the educational goals of the pulmonary rehab program.   Holiday Eating Survival Tips:  -Group instruction provided by PowerPoint slides, verbal discussion, and written materials to support subject matter. The instructor gives patients tips, tricks, and techniques to help them not only survive but enjoy the holidays despite the onslaught of food that accompanies the holidays.   Knowledge Questionnaire Score:  Knowledge Questionnaire Score - 04/29/21 0953       Knowledge Questionnaire Score   Pre Score 16/18             Core Components/Risk Factors/Patient Goals at Admission:  Personal Goals and Risk Factors at Admission - 04/29/21 0951       Core Components/Risk Factors/Patient Goals on Admission   Improve shortness of breath with ADL's Yes    Intervention Provide education, individualized exercise plan and daily activity instruction to help decrease symptoms of SOB with activities of daily living.    Expected Outcomes Short Term: Improve cardiorespiratory fitness to achieve a reduction of symptoms when performing ADLs;Long Term: Be able to perform more ADLs without symptoms or delay the onset of symptoms    Increase knowledge of respiratory medications and ability to use respiratory devices properly  Yes    Intervention Provide education and demonstration as needed of appropriate use of medications, inhalers, and oxygen therapy.    Expected Outcomes Short Term: Achieves understanding of medications use. Understands that oxygen is a medication prescribed by physician. Demonstrates appropriate use of inhaler and oxygen therapy.;Long Term: Maintain appropriate use of medications, inhalers, and oxygen therapy.             Core Components/Risk Factors/Patient Goals Review:    Core Components/Risk Factors/Patient Goals at Discharge (Final Review):    ITP Comments:   Comments:

## 2021-04-29 NOTE — Progress Notes (Signed)
Javier Phillips 75 y.o. male Pulmonary Rehab Orientation Note This patient who was referred to Pulmonary rehab by Dr. Isaiah Serge with the diagnosis of interstitial lung disease arrived today in Cardiac and Pulmonary Rehab. He arrived ambulatory with normal gait. He does not carry portable oxygen.  Per pt, he uses oxygen never. Color good, skin warm and dry. Patient is oriented to time and place. Patient's medical history, psychosocial health, and medications reviewed. Psychosocial assessment reveals pt lives with their family. Pt is currently retired. Pt hobbies include astronomy, walking daily with his wife, volunteering for a political group. Pt reports his stress level is low, he has no stressors. Pt does not exhibit signs of depression. PHQ2/9 score 0/0. Pt shows good  coping skills with positive outlook . Will continue to monitor and evaluate for changes in psychosocial behavior every 30 days while in pulmonary rehab. Physical assessment reveals heart rate is normal, breath sounds clear to auscultation, no wheezes, rales, or rhonchi. Grip strength equal, strong. Distal pulses 3+ bilateral posterior tibial pulses present. Patient reports he does take medications as prescribed. Patient states he follows a Regular diet.  He is trying to gain weight by eating more frequent meals with higher caloric choices.. Patient's weight will be monitored closely. Demonstration and practice of PLB using pulse oximeter. Patient able to return demonstration satisfactorily. Safety and hand hygiene in the exercise area reviewed with patient. Patient voices understanding of the information reviewed. Department expectations discussed with patient and achievable goals were set. The patient shows enthusiasm about attending the program and we look forward to working with this nice gentleman. The patient completed a 6 min walk test today, 04/29/2021 and to begin exercise on 05/14/2021 in the 1015 class.  [id number]

## 2021-05-14 ENCOUNTER — Other Ambulatory Visit: Payer: Self-pay

## 2021-05-14 ENCOUNTER — Encounter (HOSPITAL_COMMUNITY)
Admission: RE | Admit: 2021-05-14 | Discharge: 2021-05-14 | Disposition: A | Discharge status: Home / Self Care | Payer: Medicare PPO | Source: Ambulatory Visit | Attending: Pulmonary Disease | Admitting: Pulmonary Disease

## 2021-05-14 VITALS — Wt 133.6 lb

## 2021-05-14 DIAGNOSIS — J849 Interstitial pulmonary disease, unspecified: Secondary | ICD-10-CM | POA: Diagnosis not present

## 2021-05-14 NOTE — Progress Notes (Signed)
Daily Session Note  Patient Details  Name: Javier Phillips MRN: [id number] Date of Birth: [birth year] Referring Provider:   April Manson Pulmonary Rehab Walk Test from 04/29/2021 in [place]  Referring Provider Mannam       Encounter Date: 05/14/2021  Check In:  Session Check In - 05/14/21 1120       Check-In   Supervising physician immediately available to respond to emergencies [facility] immediately available    Physician(s) Dr. Broadus John    Location [facility]    Staff Present Rosebud Poles, RN, Quentin Ore, MS, ACSM-CEP, Exercise Physiologist;Lisa Ysidro Evert, RN    Virtual Visit No    Medication changes reported     No    Fall or balance concerns reported    No    Tobacco Cessation No Change    Warm-up and Cool-down Performed as group-led instruction    Resistance Training Performed Yes    VAD Patient? No    PAD/SET Patient? No      Pain Assessment   Currently in Pain? No/denies    Multiple Pain Sites No             Capillary Blood Glucose: No results found for this or any previous visit (from the past 24 hour(s)).   Exercise Prescription Changes - 05/14/21 1200       Response to Exercise   Blood Pressure (Admit) 130/72    Blood Pressure (Exercise) 124/62    Blood Pressure (Exit) 108/60    Heart Rate (Admit) 69 bpm    Heart Rate (Exercise) 92 bpm    Heart Rate (Exit) 75 bpm    Oxygen Saturation (Admit) 98 %    Oxygen Saturation (Exercise) 97 %    Oxygen Saturation (Exit) 98 %    Rating of Perceived Exertion (Exercise) 11    Perceived Dyspnea (Exercise) 1    Duration Continue with 30 min of aerobic exercise without signs/symptoms of physical distress.    Intensity --   40-80% HRR     Resistance Training   Training Prescription Yes    Weight Blue bands    Reps 10-15      Treadmill   MPH 1.8    Grade 1    Minutes 15      Recumbant Bike   Level 2    Watts 36    Minutes 15              Social History   Tobacco Use  Smoking Status Never  Smokeless Tobacco Never    Goals Met:  Proper associated with RPD/PD & O2 Sat Exercise tolerated well No report of concerns or symptoms today Strength training completed today  Goals Unmet:  Not Applicable  Comments: Service time is from 1017 to 1128.    Dr. Rodman Pickle is Medical Director for Pulmonary Rehab at [facility].

## 2021-05-16 ENCOUNTER — Encounter (HOSPITAL_COMMUNITY)
Admission: RE | Admit: 2021-05-16 | Discharge: 2021-05-16 | Disposition: A | Discharge status: Home / Self Care | Payer: Medicare PPO | Source: Ambulatory Visit | Attending: Pulmonary Disease | Admitting: Pulmonary Disease

## 2021-05-16 ENCOUNTER — Other Ambulatory Visit: Payer: Self-pay

## 2021-05-16 DIAGNOSIS — J849 Interstitial pulmonary disease, unspecified: Secondary | ICD-10-CM

## 2021-05-16 NOTE — Progress Notes (Signed)
Daily Session Note  Patient Details  Name: Javier Phillips MRN: [id number] Date of Birth: 12-17-1945 Referring Provider:   April Manson Pulmonary Rehab Walk Test from 04/29/2021 in [place]  Referring Provider Mann[place]       Encounter Date: 05/16/2021  Check In:  Session Check In - 05/16/21 1125       Check-In   Supervising physician immediately available to respond to emergencies [facility] immediately available    Physician(s) Dr. Starla Link    Location [facility]    Staff Present Rosebud Poles, RN, BSN;Perl Kerney Ysidro Evert, Cathleen Fears, MS, ACSM-CEP, Exercise Physiologist    Virtual Visit No    Medication changes reported     No    Fall or balance concerns reported    No    Tobacco Cessation No Change    Warm-up and Cool-down Performed as group-led instruction    Resistance Training Performed Yes    VAD Patient? No    PAD/SET Patient? No      Pain Assessment   Currently in Pain? No/denies    Multiple Pain Sites No             Capillary Blood Glucose: No results found for this or any previous visit (from the past 24 hour(s)).    Social History   Tobacco Use  Smoking Status Never  Smokeless Tobacco Never    Goals Met:  Exercise tolerated well No report of concerns or symptoms today Strength training completed today  Goals Unmet:  Not Applicable  Comments: Service time is from 1018 to [place]    Dr. Rodman Pickle is Medical Director for Pulmonary Rehab at [facility].

## 2021-05-21 ENCOUNTER — Encounter (HOSPITAL_COMMUNITY)
Admission: RE | Admit: 2021-05-21 | Discharge: 2021-05-21 | Disposition: A | Discharge status: Home / Self Care | Payer: Medicare PPO | Source: Ambulatory Visit | Attending: Pulmonary Disease | Admitting: Pulmonary Disease

## 2021-05-21 ENCOUNTER — Other Ambulatory Visit: Payer: Self-pay

## 2021-05-21 DIAGNOSIS — J849 Interstitial pulmonary disease, unspecified: Secondary | ICD-10-CM

## 2021-05-21 NOTE — Progress Notes (Signed)
Daily Session Note  Patient Details  Name: Javier Phillips MRN: [id number] Date of Birth: Sep 05, 1945 Referring Provider:   April Manson Pulmonary Rehab Walk Test from 04/29/2021 in [place]  Referring Provider Mannam       Encounter Date: 05/21/2021  Check In:  Session Check In - 05/21/21 1141       Check-In   Supervising physician immediately available to respond to emergencies [facility] immediately available    Physician(s) Dr. Cathlean Sauer    Location [facility]    Staff Present Rosebud Poles, RN, Luisa Hart, RN, BSN;Daizha Anand Ysidro Evert, Cathleen Fears, MS, ACSM-CEP, Exercise Physiologist    Virtual Visit No    Medication changes reported     No    Fall or balance concerns reported    No    Tobacco Cessation No Change    Warm-up and Cool-down Performed as group-led instruction    Resistance Training Performed Yes    VAD Patient? No    PAD/SET Patient? No      Pain Assessment   Currently in Pain? No/denies    Multiple Pain Sites No             Capillary Blood Glucose: No results found for this or any previous visit (from the past 24 hour(s)).    Social History   Tobacco Use  Smoking Status Never  Smokeless Tobacco Never    Goals Met:  Exercise tolerated well No report of concerns or symptoms today Strength training completed today  Goals Unmet:  Not Applicable  Comments: Service time is from 1020 to [place]    Dr. Rodman Pickle is Medical Director for Pulmonary Rehab at [facility].

## 2021-05-21 NOTE — Progress Notes (Deleted)
Pulmonary Individual Treatment Plan  Patient Details  Name: Javier Phillips MRN: [id number] Date of Birth: 05/05/1946 Referring Provider:   April Manson Pulmonary Rehab Walk Test from 04/29/2021 in [place]  Referring Provider Mannam       Initial Encounter Date:  Flowsheet Row Pulmonary Rehab Walk Test from 04/29/2021 in [place]  Date 04/29/21       Visit Diagnosis: No diagnosis found.  Patient's Home Medications on Admission:   Current Outpatient Medications:    acetaminophen (TYLENOL) 325 MG tablet, Take 650 mg by mouth every 6 (six) hours as needed. For fever, Disp: , Rfl:    Albuterol Sulfate (PROAIR RESPICLICK) 712 (90 Base) MCG/ACT AEPB, , Disp: , Rfl:    amitriptyline (ELAVIL) 10 MG tablet, 1 tablet at bedtime., Disp: , Rfl:    azithromycin (ZITHROMAX) 250 MG tablet, Take 2 tablets on first day, then 1 tablet daily until finished, Disp: 6 tablet, Rfl: 0   cetirizine (ZYRTEC) 10 MG tablet, Take 10 mg by mouth daily., Disp: , Rfl:    famotidine (PEPCID) 20 MG tablet, Take 20 mg by mouth daily as needed., Disp: , Rfl:    fluticasone (FLONASE) 50 MCG/ACT nasal spray, Place into both nostrils daily as needed., Disp: , Rfl:    oxybutynin (DITROPAN-XL) 10 MG 24 hr tablet, Take 10 mg by mouth daily., Disp: , Rfl:    Probiotic Product (PROBIOTIC PO), Take by mouth daily., Disp: , Rfl:    Tamsulosin HCl (FLOMAX) 0.4 MG CAPS, Take 0.4 mg by mouth daily., Disp: , Rfl:   Past Medical History: Past Medical History:  Diagnosis Date   Enlarged prostate    Heart murmur    Diagnosis of MVP years ago   Interstitial cystitis    Interstitial lung disease ([place])     Tobacco Use: Social History   Tobacco Use  Smoking Status Never  Smokeless Tobacco Never    Labs: Recent Review Flowsheet Data   There is no flowsheet data to display.     Capillary Blood Glucose: No results found for:  GLUCAP   Pulmonary Assessment Scores:  Pulmonary Assessment Scores     Row Name 04/29/21 0942         ADL UCSD   ADL Phase Entry     SOB Score total 24       CAT Score   CAT Score 7       mMRC Score   mMRC Score 1             UCSD: Self-administered rating of dyspnea associated with activities of daily living (ADLs) 6-point scale (0 = "not at all" to 5 = "maximal or unable to do because of breathlessness")  Scoring Scores range from 0 to 120.  Minimally important difference is 5 units  CAT: CAT can identify the health impairment of COPD patients and is better correlated with disease progression.  CAT has a scoring range of zero to 40. The CAT score is classified into four groups of low (less than 10), medium (10 - 20), high (21-30) and very high (31-40) based on the impact level of disease on health status. A CAT score over 10 suggests significant symptoms.  A worsening CAT score could be explained by an exacerbation, poor medication adherence, poor inhaler technique, or progression of COPD or comorbid conditions.  CAT MCID is 2 points  mMRC: mMRC (Modified Medical Research Council) Dyspnea Scale is used to assess  the degree of baseline functional disability in patients of respiratory disease due to dyspnea. No minimal important difference is established. A decrease in score of 1 point or greater is considered a positive change.   Pulmonary Function Assessment:  Pulmonary Function Assessment - 04/29/21 0937       Breath   Bilateral Breath Sounds Clear   crackles 1/2 up on right and 1/3 up on left   Shortness of Breath No             Exercise Target Goals: Exercise Program Goal: Individual exercise prescription set using results from initial 6 min walk test and THRR while considering  patient's activity barriers and safety.   Exercise Prescription Goal: Initial exercise prescription builds to 30-45 minutes a day of aerobic activity, 2-3 days per week.  Home  exercise guidelines will be given to patient during program as part of exercise prescription that the participant will acknowledge.  Activity Barriers & Risk Stratification:  Activity Barriers & Cardiac Risk Stratification - 04/29/21 0935       Activity Barriers & Cardiac Risk Stratification   Activity Barriers Arthritis;Deconditioning;Muscular Weakness;Shortness of Breath             6 Minute Walk:  6 Minute Walk     Row Name 04/29/21 1028         6 Minute Walk   Phase Initial     Distance 1530 feet     Walk Time 6 minutes     # of Rest Breaks 0     MPH 2.9     METS 3.15     RPE 11     Perceived Dyspnea  1     VO2 Peak 11.01     Symptoms No     Resting HR 79 bpm     Resting BP 104/56     Resting Oxygen Saturation  98 %     Exercise Oxygen Saturation  during 6 min walk 94 %     Max Ex. HR 99 bpm     Max Ex. BP 110/60     2 Minute Post BP 102/60       Interval HR   1 Minute HR 76     2 Minute HR 76     3 Minute HR 86     4 Minute HR 89     5 Minute HR 91     6 Minute HR 99     2 Minute Post HR 69     Interval Heart Rate? Yes       Interval Oxygen   Interval Oxygen? Yes     Baseline Oxygen Saturation % 98 %     1 Minute Oxygen Saturation % 97 %     1 Minute Liters of Oxygen 0 L     2 Minute Oxygen Saturation % 85 %  Cold finger     2 Minute Liters of Oxygen 0 L     3 Minute Oxygen Saturation % 96 %     3 Minute Liters of Oxygen 0 L     4 Minute Oxygen Saturation % 95 %     4 Minute Liters of Oxygen 0 L     5 Minute Oxygen Saturation % 96 %     5 Minute Liters of Oxygen 0 L     6 Minute Oxygen Saturation % 94 %     6 Minute Liters of Oxygen 0 L     2  Minute Post Oxygen Saturation % 99 %     2 Minute Post Liters of Oxygen 0 L              Oxygen Initial Assessment:  Oxygen Initial Assessment - 05/13/21 0950       Initial 6 min Walk   Oxygen Used None             Oxygen Re-Evaluation:  Oxygen Re-Evaluation     Row Name 04/29/21  0936 05/13/21 0950           Program Oxygen Prescription   Program Oxygen Prescription None None        Home Oxygen   Home Oxygen Device -- None      Sleep Oxygen Prescription -- None      Home Exercise Oxygen Prescription -- None      Home Resting Oxygen Prescription -- None        Goals/Expected Outcomes   Short Term Goals To learn and exhibit compliance with exercise, home and travel O2 prescription;To learn and understand importance of monitoring SPO2 with pulse oximeter and demonstrate accurate use of the pulse oximeter.;To learn and understand importance of maintaining oxygen saturations>88%;To learn and demonstrate proper pursed lip breathing techniques or other breathing techniques. ;To learn and demonstrate proper use of respiratory medications To learn and exhibit compliance with exercise, home and travel O2 prescription;To learn and understand importance of monitoring SPO2 with pulse oximeter and demonstrate accurate use of the pulse oximeter.;To learn and understand importance of maintaining oxygen saturations>88%;To learn and demonstrate proper pursed lip breathing techniques or other breathing techniques. ;To learn and demonstrate proper use of respiratory medications      Long  Term Goals Exhibits compliance with exercise, home  and travel O2 prescription;Verbalizes importance of monitoring SPO2 with pulse oximeter and return demonstration;Maintenance of O2 saturations>88%;Exhibits proper breathing techniques, such as pursed lip breathing or other method taught during program session;Compliance with respiratory medication;Demonstrates proper use of MDI's Exhibits compliance with exercise, home  and travel O2 prescription;Verbalizes importance of monitoring SPO2 with pulse oximeter and return demonstration;Maintenance of O2 saturations>88%;Exhibits proper breathing techniques, such as pursed lip breathing or other method taught during program session;Compliance with respiratory  medication;Demonstrates proper use of MDI's      Goals/Expected Outcomes Compliance and understanding of oxygen saturation monitoring and breathing techniques to decrease shortness of breath. Compliance and understanding of oxygen saturation monitoring and breathing techniques to decrease shortness of breath.               Oxygen Discharge (Final Oxygen Re-Evaluation):  Oxygen Re-Evaluation - 05/13/21 0950       Program Oxygen Prescription   Program Oxygen Prescription None      Home Oxygen   Home Oxygen Device None    Sleep Oxygen Prescription None    Home Exercise Oxygen Prescription None    Home Resting Oxygen Prescription None      Goals/Expected Outcomes   Short Term Goals To learn and exhibit compliance with exercise, home and travel O2 prescription;To learn and understand importance of monitoring SPO2 with pulse oximeter and demonstrate accurate use of the pulse oximeter.;To learn and understand importance of maintaining oxygen saturations>88%;To learn and demonstrate proper pursed lip breathing techniques or other breathing techniques. ;To learn and demonstrate proper use of respiratory medications    Long  Term Goals Exhibits compliance with exercise, home  and travel O2 prescription;Verbalizes importance of monitoring SPO2 with pulse oximeter and return demonstration;Maintenance of O2 saturations>88%;Exhibits proper  breathing techniques, such as pursed lip breathing or other method taught during program session;Compliance with respiratory medication;Demonstrates proper use of MDI's    Goals/Expected Outcomes Compliance and understanding of oxygen saturation monitoring and breathing techniques to decrease shortness of breath.             Initial Exercise Prescription:  Initial Exercise Prescription - 04/29/21 1000       Date of Initial Exercise RX and Referring Provider   Date 04/29/21    Referring Provider Mannam    Expected Discharge Date 07/11/21      Treadmill    MPH 1.8    Grade 1    Minutes 15      Recumbant Bike   Level 2    Watts 22    Minutes 15      Prescription Details   Frequency (times per week) 2    Duration Progress to 30 minutes of continuous aerobic without signs/symptoms of physical distress      Intensity   THRR 40-80% of Max Heartrate 58-116    Ratings of Perceived Exertion 11-13      Progression   Progression Continue to progress workloads to maintain intensity without signs/symptoms of physical distress.      Resistance Training   Training Prescription Yes    Weight Blue bands    Reps 10-15             Perform Capillary Blood Glucose checks as needed.  Exercise Prescription Changes:   Exercise Prescription Changes     Row Name 05/14/21 1200             Response to Exercise   Blood Pressure (Admit) 130/72       Blood Pressure (Exercise) 124/62       Blood Pressure (Exit) 108/60       Heart Rate (Admit) 69 bpm       Heart Rate (Exercise) 92 bpm       Heart Rate (Exit) 75 bpm       Oxygen Saturation (Admit) 98 %       Oxygen Saturation (Exercise) 97 %       Oxygen Saturation (Exit) 98 %       Rating of Perceived Exertion (Exercise) 11       Perceived Dyspnea (Exercise) 1       Duration Continue with 30 min of aerobic exercise without signs/symptoms of physical distress.       Intensity --  40-80% HRR         Resistance Training   Training Prescription Yes       Weight Blue bands       Reps 10-15         Treadmill   MPH 1.8       Grade 1       Minutes 15       METs 2.63         Recumbant Bike   Level 2       Watts 36       Minutes 15       METs 3                Exercise Comments:   Exercise Comments     Row Name 05/14/21 1227           Exercise Comments Rashed completed his 1st day of exercise. He exercised for 15 min on the treadmill and recumbent bike. He averaged 2.63 METs at 1.8 mph @  1.0% on the treadmill and 3.0 METs at level 2 on the recumbent bike. Cobain  performed the warmup and cooldown standing without limitations. He is motivated to exercise as he walks with his wife before starting rehab. He has a positive attitude.                Exercise Goals and Review:   Exercise Goals     Row Name 04/29/21 1033 05/13/21 0946           Exercise Goals   Increase Physical Activity Yes Yes      Intervention Provide advice, education, support and counseling about physical activity/exercise needs.;Develop an individualized exercise prescription for aerobic and resistive training based on initial evaluation findings, risk stratification, comorbidities and participant's personal goals. Provide advice, education, support and counseling about physical activity/exercise needs.;Develop an individualized exercise prescription for aerobic and resistive training based on initial evaluation findings, risk stratification, comorbidities and participant's personal goals.      Expected Outcomes Short Term: Attend rehab on a regular basis to increase amount of physical activity.;Long Term: Add in home exercise to make exercise part of routine and to increase amount of physical activity.;Long Term: Exercising regularly at least 3-5 days a week. Short Term: Attend rehab on a regular basis to increase amount of physical activity.;Long Term: Add in home exercise to make exercise part of routine and to increase amount of physical activity.;Long Term: Exercising regularly at least 3-5 days a week.      Increase Strength and Stamina Yes Yes      Intervention Provide advice, education, support and counseling about physical activity/exercise needs.;Develop an individualized exercise prescription for aerobic and resistive training based on initial evaluation findings, risk stratification, comorbidities and participant's personal goals. Provide advice, education, support and counseling about physical activity/exercise needs.;Develop an individualized exercise prescription for  aerobic and resistive training based on initial evaluation findings, risk stratification, comorbidities and participant's personal goals.      Expected Outcomes Short Term: Increase workloads from initial exercise prescription for resistance, speed, and METs.;Short Term: Perform resistance training exercises routinely during rehab and add in resistance training at home;Long Term: Improve cardiorespiratory fitness, muscular endurance and strength as measured by increased METs and functional capacity (6MWT) Short Term: Increase workloads from initial exercise prescription for resistance, speed, and METs.;Short Term: Perform resistance training exercises routinely during rehab and add in resistance training at home;Long Term: Improve cardiorespiratory fitness, muscular endurance and strength as measured by increased METs and functional capacity (6MWT)      Able to understand and use rate of perceived exertion (RPE) scale Yes Yes      Intervention Provide education and explanation on how to use RPE scale Provide education and explanation on how to use RPE scale      Expected Outcomes Short Term: Able to use RPE daily in rehab to express subjective intensity level;Long Term:  Able to use RPE to guide intensity level when exercising independently Short Term: Able to use RPE daily in rehab to express subjective intensity level;Long Term:  Able to use RPE to guide intensity level when exercising independently      Able to understand and use Dyspnea scale Yes Yes      Intervention Provide education and explanation on how to use Dyspnea scale Provide education and explanation on how to use Dyspnea scale      Expected Outcomes Short Term: Able to use Dyspnea scale daily in rehab to express subjective sense of shortness of breath during  exertion;Long Term: Able to use Dyspnea scale to guide intensity level when exercising independently Short Term: Able to use Dyspnea scale daily in rehab to express subjective sense of  shortness of breath during exertion;Long Term: Able to use Dyspnea scale to guide intensity level when exercising independently      Knowledge and understanding of Target Heart Rate Range (THRR) Yes Yes      Intervention Provide education and explanation of THRR including how the numbers were predicted and where they are located for reference Provide education and explanation of THRR including how the numbers were predicted and where they are located for reference      Expected Outcomes Short Term: Able to state/look up THRR;Short Term: Able to use daily as guideline for intensity in rehab;Long Term: Able to use THRR to govern intensity when exercising independently Short Term: Able to state/look up THRR;Short Term: Able to use daily as guideline for intensity in rehab;Long Term: Able to use THRR to govern intensity when exercising independently      Expected Outcomes -- --      Understanding of Exercise Prescription Yes Yes      Intervention Provide education, explanation, and written materials on patient's individual exercise prescription Provide education, explanation, and written materials on patient's individual exercise prescription      Expected Outcomes Short Term: Able to explain program exercise prescription;Long Term: Able to explain home exercise prescription to exercise independently Short Term: Able to explain program exercise prescription;Long Term: Able to explain home exercise prescription to exercise independently               Exercise Goals Re-Evaluation :  Exercise Goals Re-Evaluation     Row Name 05/13/21 0946             Exercise Goal Re-Evaluation   Exercise Goals Review Increase Physical Activity;Increase Strength and Stamina;Able to understand and use rate of perceived exertion (RPE) scale;Able to understand and use Dyspnea scale;Knowledge and understanding of Target Heart Rate Range (THRR);Understanding of Exercise Prescription       Comments Pryor is scheduled to  begin exercise this week. Will monitor and progress as able.       Expected Outcomes Through exercise at rehab and home, the patient will decrease shortness of breath with daily activities and feel confident in carrying out an exercise regimen at home.                Discharge Exercise Prescription (Final Exercise Prescription Changes):  Exercise Prescription Changes - 05/14/21 1200       Response to Exercise   Blood Pressure (Admit) 130/72    Blood Pressure (Exercise) 124/62    Blood Pressure (Exit) 108/60    Heart Rate (Admit) 69 bpm    Heart Rate (Exercise) 92 bpm    Heart Rate (Exit) 75 bpm    Oxygen Saturation (Admit) 98 %    Oxygen Saturation (Exercise) 97 %    Oxygen Saturation (Exit) 98 %    Rating of Perceived Exertion (Exercise) 11    Perceived Dyspnea (Exercise) 1    Duration Continue with 30 min of aerobic exercise without signs/symptoms of physical distress.    Intensity --   40-80% HRR     Resistance Training   Training Prescription Yes    Weight Blue bands    Reps 10-15      Treadmill   MPH 1.8    Grade 1    Minutes 15    METs 2.63  Recumbant Bike   Level 2    Watts 36    Minutes 15    METs 3             Nutrition:  Target Goals: Understanding of nutrition guidelines, daily intake of sodium '1500mg'$ , cholesterol '200mg'$ , calories 30% from fat and 7% or less from saturated fats, daily to have 5 or more servings of fruits and vegetables.  Biometrics:    Nutrition Therapy Plan and Nutrition Goals:   Nutrition Assessments:  MEDIFICTS Score Key: ?70 Need to make dietary changes  40-70 Heart Healthy Diet ? 40 Therapeutic Level Cholesterol Diet   Picture Your Plate Scores: <29 Unhealthy dietary pattern with much room for improvement. 41-50 Dietary pattern unlikely to meet recommendations for good health and room for improvement. 51-60 More healthful dietary pattern, with some room for improvement.  >60 Healthy dietary pattern,  although there may be some specific behaviors that could be improved.    Nutrition Goals Re-Evaluation:   Nutrition Goals Discharge (Final Nutrition Goals Re-Evaluation):   Psychosocial: Target Goals: Acknowledge presence or absence of significant depression and/or stress, maximize coping skills, provide positive support system. Participant is able to verbalize types and ability to use techniques and skills needed for reducing stress and depression.  Initial Review & Psychosocial Screening:  Initial Psych Review & Screening - 04/29/21 0944       Initial Review   Current issues with None Identified      Family Dynamics   Good Support System? Yes   wife   Comments No concerns identified at this time      Barriers   Psychosocial barriers to participate in program There are no identifiable barriers or psychosocial needs.      Screening Interventions   Interventions Encouraged to exercise             Quality of Life Scores:  Scores of 19 and below usually indicate a poorer quality of life in these areas.  A difference of  2-3 points is a clinically meaningful difference.  A difference of 2-3 points in the total score of the Quality of Life Index has been associated with significant improvement in overall quality of life, self-image, physical symptoms, and general health in studies assessing change in quality of life.  PHQ-9: Recent Review Flowsheet Data     Depression screen [facility] 2/9 04/29/2021 04/29/2021   Decreased Interest 0 0   Down, Depressed, Hopeless 0 0   PHQ - 2 Score 0 0   Altered sleeping 0 -   Tired, decreased energy 0 -   Change in appetite 0 -   Feeling bad or failure about yourself  0 -   Trouble concentrating 0 -   Moving slowly or fidgety/restless 0 -   Suicidal thoughts 0 -   PHQ-9 Score 0 -   Difficult doing work/chores Not difficult at all -      Interpretation of Total Score  Total Score Depression Severity:  1-4 = Minimal depression, 5-9 =  Mild depression, 10-14 = Moderate depression, 15-19 = Moderately severe depression, 20-27 = Severe depression   Psychosocial Evaluation and Intervention:  Psychosocial Evaluation - 04/29/21 0947       Psychosocial Evaluation & Interventions   Interventions Encouraged to exercise with the program and follow exercise prescription    Comments No concerns identified    Expected Outcomes To continue to be free of psychosocial concerns while participating in pulmonary rehab.    Continue Psychosocial Services  No Follow up required             Psychosocial Re-Evaluation:  Psychosocial Re-Evaluation     [place] Name 05/14/21 1201             Psychosocial Re-Evaluation   Current issues with None Identified       Comments Trentin has attended only one session and can not evaluate psychosocial barries at this time.       Expected Outcomes Will continue to monitor as Fitzpatrick progresses in the PR program.       Interventions Stress management education;Encouraged to attend Cardiac Rehabilitation for the exercise;Encouraged to attend Pulmonary Rehabilitation for the exercise       Continue Psychosocial Services  No Follow up required                Psychosocial Discharge (Final Psychosocial Re-Evaluation):  Psychosocial Re-Evaluation - 05/14/21 1201       Psychosocial Re-Evaluation   Current issues with None Identified    Comments Nicholes has attended only one session and can not evaluate psychosocial barries at this time.    Expected Outcomes Will continue to monitor as Bernd progresses in the PR program.    Interventions Stress management education;Encouraged to attend Cardiac Rehabilitation for the exercise;Encouraged to attend Pulmonary Rehabilitation for the exercise    Continue Psychosocial Services  No Follow up required             Education: Education Goals: Education classes will be provided on a weekly basis, covering required topics. Participant will state  understanding/return demonstration of topics presented.  Learning Barriers/Preferences:  Learning Barriers/Preferences - 04/29/21 1002       Learning Barriers/Preferences   Learning Barriers None    Learning Preferences Computer/Internet;Group Instruction;Individual Instruction;Pictoral;Skilled Demonstration;Verbal Instruction;Video;Written Material             Education Topics: Risk Factor Reduction:  -Group instruction that is supported by a PowerPoint presentation. Instructor discusses the definition of a risk factor, different risk factors for pulmonary disease, and how the heart and lungs work together.   Flowsheet Row PULMONARY REHAB OTHER RESPIRATORY from 05/16/2021 in [place]  Date 05/16/21  Educator Remo Lipps  [Handout]       Nutrition for Pulmonary Patient:  -Group instruction provided by PowerPoint slides, verbal discussion, and written materials to support subject matter. The instructor gives an explanation and review of healthy diet recommendations, which includes a discussion on weight management, recommendations for fruit and vegetable consumption, as well as protein, fluid, caffeine, fiber, sodium, sugar, and alcohol. Tips for eating when patients are short of breath are discussed.   Pursed Lip Breathing:  -Group instruction that is supported by demonstration and informational handouts. Instructor discusses the benefits of pursed lip and diaphragmatic breathing and detailed demonstration on how to preform both.     Oxygen Safety:  -Group instruction provided by PowerPoint, verbal discussion, and written material to support subject matter. There is an overview of "What is Oxygen" and "Why do we need it".  Instructor also reviews how to create a safe environment for oxygen use, the importance of using oxygen as prescribed, and the risks of noncompliance. There is a brief discussion on traveling with oxygen and resources the patient may  utilize.   Oxygen Equipment:  -Group instruction provided by [facility] Staff utilizing handouts, written materials, and equipment demonstrations.   Signs and Symptoms:  -Group instruction provided by written material and verbal discussion to support subject matter.  Warning signs and symptoms of infection, stroke, and heart attack are reviewed and when to call the physician/911 reinforced. Tips for preventing the spread of infection discussed.   Advanced Directives:  -Group instruction provided by verbal instruction and written material to support subject matter. Instructor reviews Advanced Directive laws and proper instruction for filling out document.   Pulmonary Video:  -Group video education that reviews the importance of medication and oxygen compliance, exercise, good nutrition, pulmonary hygiene, and pursed lip and diaphragmatic breathing for the pulmonary patient.   Exercise for the Pulmonary Patient:  -Group instruction that is supported by a PowerPoint presentation. Instructor discusses benefits of exercise, core components of exercise, frequency, duration, and intensity of an exercise routine, importance of utilizing pulse oximetry during exercise, safety while exercising, and options of places to exercise outside of rehab.     Pulmonary Medications:  -Verbally interactive group education provided by instructor with focus on inhaled medications and proper administration.   Anatomy and Physiology of the Respiratory System and Intimacy:  -Group instruction provided by PowerPoint, verbal discussion, and written material to support subject matter. Instructor reviews respiratory cycle and anatomical components of the respiratory system and their functions. Instructor also reviews differences in obstructive and restrictive respiratory diseases with examples of each. Intimacy, Sex, and Sexuality differences are reviewed with a discussion on how relationships can change when diagnosed  with pulmonary disease. Common sexual concerns are reviewed.   MD DAY -A group question and answer session with a medical doctor that allows participants to ask questions that relate to their pulmonary disease state.   OTHER EDUCATION -Group or individual verbal, written, or video instructions that support the educational goals of the pulmonary rehab program.   Holiday Eating Survival Tips:  -Group instruction provided by PowerPoint slides, verbal discussion, and written materials to support subject matter. The instructor gives patients tips, tricks, and techniques to help them not only survive but enjoy the holidays despite the onslaught of food that accompanies the holidays.   Knowledge Questionnaire Score:  Knowledge Questionnaire Score - 04/29/21 0953       Knowledge Questionnaire Score   Pre Score 16/18             Core Components/Risk Factors/Patient Goals at Admission:  Personal Goals and Risk Factors at Admission - 04/29/21 0951       Core Components/Risk Factors/Patient Goals on Admission   Improve shortness of breath with ADL's Yes    Intervention Provide education, individualized exercise plan and daily activity instruction to help decrease symptoms of SOB with activities of daily living.    Expected Outcomes Short Term: Improve cardiorespiratory fitness to achieve a reduction of symptoms when performing ADLs;Long Term: Be able to perform more ADLs without symptoms or delay the onset of symptoms    Increase knowledge of respiratory medications and ability to use respiratory devices properly  Yes    Intervention Provide education and demonstration as needed of appropriate use of medications, inhalers, and oxygen therapy.    Expected Outcomes Short Term: Achieves understanding of medications use. Understands that oxygen is a medication prescribed by physician. Demonstrates appropriate use of inhaler and oxygen therapy.;Long Term: Maintain appropriate use of medications,  inhalers, and oxygen therapy.             Core Components/Risk Factors/Patient Goals Review:   Goals and Risk Factor Review     Row Name 05/14/21 1215             Core Components/Risk Factors/Patient Goals  Review   Personal Goals Review Improve shortness of breath with ADL's;Develop more efficient breathing techniques such as purse lipped breathing and diaphragmatic breathing and practicing self-pacing with activity.;Increase knowledge of respiratory medications and ability to use respiratory devices properly.       Review Antoinne has attended only one session, to soon to address core compents at this time.       Expected Outcomes See initial core components                Core Components/Risk Factors/Patient Goals at Discharge (Final Review):   Goals and Risk Factor Review - 05/14/21 1215       Core Components/Risk Factors/Patient Goals Review   Personal Goals Review Improve shortness of breath with ADL's;Develop more efficient breathing techniques such as purse lipped breathing and diaphragmatic breathing and practicing self-pacing with activity.;Increase knowledge of respiratory medications and ability to use respiratory devices properly.    Review Irma has attended only one session, to soon to address core compents at this time.    Expected Outcomes See initial core components             ITP Comments:   Comments: ITP REVIEW Pt is making expected progress toward pulmonary rehab goals after completing 2 sessions. Recommend continued exercise, life style modification, education, and utilization of breathing techniques to increase stamina and strength and decrease shortness of breath with exertion.  Dr. Rodman Pickle is Medical Director for Pulmonary Rehab at [facility].

## 2021-05-21 NOTE — Progress Notes (Signed)
Pulmonary Individual Treatment Plan  Patient Details  Name: Javier Phillips MRN: [id number] Date of Birth: 11-17-1945 Referring Provider:   April Manson Pulmonary Rehab Walk Test from 04/29/2021 in [place]  Referring Provider Mannam       Initial Encounter Date:  Flowsheet Row Pulmonary Rehab Walk Test from 04/29/2021 in [place]  Date 04/29/21       Visit Diagnosis: Interstitial lung disease ([place])  Patient's Home Medications on Admission:   Current Outpatient Medications:    acetaminophen (TYLENOL) 325 MG tablet, Take 650 mg by mouth every 6 (six) hours as needed. For fever, Disp: , Rfl:    Albuterol Sulfate (PROAIR RESPICLICK) 096 (90 Base) MCG/ACT AEPB, , Disp: , Rfl:    amitriptyline (ELAVIL) 10 MG tablet, 1 tablet at bedtime., Disp: , Rfl:    azithromycin (ZITHROMAX) 250 MG tablet, Take 2 tablets on first day, then 1 tablet daily until finished, Disp: 6 tablet, Rfl: 0   cetirizine (ZYRTEC) 10 MG tablet, Take 10 mg by mouth daily., Disp: , Rfl:    famotidine (PEPCID) 20 MG tablet, Take 20 mg by mouth daily as needed., Disp: , Rfl:    fluticasone (FLONASE) 50 MCG/ACT nasal spray, Place into both nostrils daily as needed., Disp: , Rfl:    oxybutynin (DITROPAN-XL) 10 MG 24 hr tablet, Take 10 mg by mouth daily., Disp: , Rfl:    Probiotic Product (PROBIOTIC PO), Take by mouth daily., Disp: , Rfl:    Tamsulosin HCl (FLOMAX) 0.4 MG CAPS, Take 0.4 mg by mouth daily., Disp: , Rfl:   Past Medical History: Past Medical History:  Diagnosis Date   Enlarged prostate    Heart murmur    Diagnosis of MVP years ago   Interstitial cystitis    Interstitial lung disease ([place])     Tobacco Use: Social History   Tobacco Use  Smoking Status Never  Smokeless Tobacco Never    Labs: Recent Review Flowsheet Data   There is no flowsheet data to display.     Capillary Blood Glucose: No results found for:  GLUCAP   Pulmonary Assessment Scores:  Pulmonary Assessment Scores     Row Name 04/29/21 0942         ADL UCSD   ADL Phase Entry     SOB Score total 24       CAT Score   CAT Score 7       mMRC Score   mMRC Score 1             UCSD: Self-administered rating of dyspnea associated with activities of daily living (ADLs) 6-point scale (0 = "not at all" to 5 = "maximal or unable to do because of breathlessness")  Scoring Scores range from 0 to 120.  Minimally important difference is 5 units  CAT: CAT can identify the health impairment of COPD patients and is better correlated with disease progression.  CAT has a scoring range of zero to 40. The CAT score is classified into four groups of low (less than 10), medium (10 - 20), high (21-30) and very high (31-40) based on the impact level of disease on health status. A CAT score over 10 suggests significant symptoms.  A worsening CAT score could be explained by an exacerbation, poor medication adherence, poor inhaler technique, or progression of COPD or comorbid conditions.  CAT MCID is 2 points  mMRC: mMRC (Modified Medical Research Council) Dyspnea Scale is used to  assess the degree of baseline functional disability in patients of respiratory disease due to dyspnea. No minimal important difference is established. A decrease in score of 1 point or greater is considered a positive change.   Pulmonary Function Assessment:  Pulmonary Function Assessment - 04/29/21 0937       Breath   Bilateral Breath Sounds Clear   crackles 1/2 up on right and 1/3 up on left   Shortness of Breath No             Exercise Target Goals: Exercise Program Goal: Individual exercise prescription set using results from initial 6 min walk test and THRR while considering  patient's activity barriers and safety.   Exercise Prescription Goal: Initial exercise prescription builds to 30-45 minutes a day of aerobic activity, 2-3 days per week.  Home  exercise guidelines will be given to patient during program as part of exercise prescription that the participant will acknowledge.  Activity Barriers & Risk Stratification:  Activity Barriers & Cardiac Risk Stratification - 04/29/21 0935       Activity Barriers & Cardiac Risk Stratification   Activity Barriers Arthritis;Deconditioning;Muscular Weakness;Shortness of Breath             6 Minute Walk:  6 Minute Walk     Row Name 04/29/21 1028         6 Minute Walk   Phase Initial     Distance 1530 feet     Walk Time 6 minutes     # of Rest Breaks 0     MPH 2.9     METS 3.15     RPE 11     Perceived Dyspnea  1     VO2 Peak 11.01     Symptoms No     Resting HR 79 bpm     Resting BP 104/56     Resting Oxygen Saturation  98 %     Exercise Oxygen Saturation  during 6 min walk 94 %     Max Ex. HR 99 bpm     Max Ex. BP 110/60     2 Minute Post BP 102/60       Interval HR   1 Minute HR 76     2 Minute HR 76     3 Minute HR 86     4 Minute HR 89     5 Minute HR 91     6 Minute HR 99     2 Minute Post HR 69     Interval Heart Rate? Yes       Interval Oxygen   Interval Oxygen? Yes     Baseline Oxygen Saturation % 98 %     1 Minute Oxygen Saturation % 97 %     1 Minute Liters of Oxygen 0 L     2 Minute Oxygen Saturation % 85 %  Cold finger     2 Minute Liters of Oxygen 0 L     3 Minute Oxygen Saturation % 96 %     3 Minute Liters of Oxygen 0 L     4 Minute Oxygen Saturation % 95 %     4 Minute Liters of Oxygen 0 L     5 Minute Oxygen Saturation % 96 %     5 Minute Liters of Oxygen 0 L     6 Minute Oxygen Saturation % 94 %     6 Minute Liters of Oxygen 0 L  2 Minute Post Oxygen Saturation % 99 %     2 Minute Post Liters of Oxygen 0 L              Oxygen Initial Assessment:  Oxygen Initial Assessment - 05/13/21 0950       Initial 6 min Walk   Oxygen Used None             Oxygen Re-Evaluation:  Oxygen Re-Evaluation     Row Name 04/29/21  0936 05/13/21 0950           Program Oxygen Prescription   Program Oxygen Prescription None None        Home Oxygen   Home Oxygen Device -- None      Sleep Oxygen Prescription -- None      Home Exercise Oxygen Prescription -- None      Home Resting Oxygen Prescription -- None        Goals/Expected Outcomes   Short Term Goals To learn and exhibit compliance with exercise, home and travel O2 prescription;To learn and understand importance of monitoring SPO2 with pulse oximeter and demonstrate accurate use of the pulse oximeter.;To learn and understand importance of maintaining oxygen saturations>88%;To learn and demonstrate proper pursed lip breathing techniques or other breathing techniques. ;To learn and demonstrate proper use of respiratory medications To learn and exhibit compliance with exercise, home and travel O2 prescription;To learn and understand importance of monitoring SPO2 with pulse oximeter and demonstrate accurate use of the pulse oximeter.;To learn and understand importance of maintaining oxygen saturations>88%;To learn and demonstrate proper pursed lip breathing techniques or other breathing techniques. ;To learn and demonstrate proper use of respiratory medications      Long  Term Goals Exhibits compliance with exercise, home  and travel O2 prescription;Verbalizes importance of monitoring SPO2 with pulse oximeter and return demonstration;Maintenance of O2 saturations>88%;Exhibits proper breathing techniques, such as pursed lip breathing or other method taught during program session;Compliance with respiratory medication;Demonstrates proper use of MDI's Exhibits compliance with exercise, home  and travel O2 prescription;Verbalizes importance of monitoring SPO2 with pulse oximeter and return demonstration;Maintenance of O2 saturations>88%;Exhibits proper breathing techniques, such as pursed lip breathing or other method taught during program session;Compliance with respiratory  medication;Demonstrates proper use of MDI's      Goals/Expected Outcomes Compliance and understanding of oxygen saturation monitoring and breathing techniques to decrease shortness of breath. Compliance and understanding of oxygen saturation monitoring and breathing techniques to decrease shortness of breath.               Oxygen Discharge (Final Oxygen Re-Evaluation):  Oxygen Re-Evaluation - 05/13/21 0950       Program Oxygen Prescription   Program Oxygen Prescription None      Home Oxygen   Home Oxygen Device None    Sleep Oxygen Prescription None    Home Exercise Oxygen Prescription None    Home Resting Oxygen Prescription None      Goals/Expected Outcomes   Short Term Goals To learn and exhibit compliance with exercise, home and travel O2 prescription;To learn and understand importance of monitoring SPO2 with pulse oximeter and demonstrate accurate use of the pulse oximeter.;To learn and understand importance of maintaining oxygen saturations>88%;To learn and demonstrate proper pursed lip breathing techniques or other breathing techniques. ;To learn and demonstrate proper use of respiratory medications    Long  Term Goals Exhibits compliance with exercise, home  and travel O2 prescription;Verbalizes importance of monitoring SPO2 with pulse oximeter and return demonstration;Maintenance of O2 saturations>88%;Exhibits  proper breathing techniques, such as pursed lip breathing or other method taught during program session;Compliance with respiratory medication;Demonstrates proper use of MDI's    Goals/Expected Outcomes Compliance and understanding of oxygen saturation monitoring and breathing techniques to decrease shortness of breath.             Initial Exercise Prescription:  Initial Exercise Prescription - 04/29/21 1000       Date of Initial Exercise RX and Referring Provider   Date 04/29/21    Referring Provider Mannam    Expected Discharge Date 07/11/21      Treadmill    MPH 1.8    Grade 1    Minutes 15      Recumbant Bike   Level 2    Watts 22    Minutes 15      Prescription Details   Frequency (times per week) 2    Duration Progress to 30 minutes of continuous aerobic without signs/symptoms of physical distress      Intensity   THRR 40-80% of Max Heartrate 58-116    Ratings of Perceived Exertion 11-13      Progression   Progression Continue to progress workloads to maintain intensity without signs/symptoms of physical distress.      Resistance Training   Training Prescription Yes    Weight Blue bands    Reps 10-15             Perform Capillary Blood Glucose checks as needed.  Exercise Prescription Changes:   Exercise Prescription Changes     Row Name 05/14/21 1200             Response to Exercise   Blood Pressure (Admit) 130/72       Blood Pressure (Exercise) 124/62       Blood Pressure (Exit) 108/60       Heart Rate (Admit) 69 bpm       Heart Rate (Exercise) 92 bpm       Heart Rate (Exit) 75 bpm       Oxygen Saturation (Admit) 98 %       Oxygen Saturation (Exercise) 97 %       Oxygen Saturation (Exit) 98 %       Rating of Perceived Exertion (Exercise) 11       Perceived Dyspnea (Exercise) 1       Duration Continue with 30 min of aerobic exercise without signs/symptoms of physical distress.       Intensity --  40-80% HRR         Resistance Training   Training Prescription Yes       Weight Blue bands       Reps 10-15         Treadmill   MPH 1.8       Grade 1       Minutes 15       METs 2.63         Recumbant Bike   Level 2       Watts 36       Minutes 15       METs 3                Exercise Comments:   Exercise Comments     Row Name 05/14/21 1227           Exercise Comments Kayshaun completed his 1st day of exercise. He exercised for 15 min on the treadmill and recumbent bike. He averaged 2.63 METs at 1.8  mph @ 1.0% on the treadmill and 3.0 METs at level 2 on the recumbent bike. Oley  performed the warmup and cooldown standing without limitations. He is motivated to exercise as he walks with his wife before starting rehab. He has a positive attitude.                Exercise Goals and Review:   Exercise Goals     Row Name 04/29/21 1033 05/13/21 0946           Exercise Goals   Increase Physical Activity Yes Yes      Intervention Provide advice, education, support and counseling about physical activity/exercise needs.;Develop an individualized exercise prescription for aerobic and resistive training based on initial evaluation findings, risk stratification, comorbidities and participant's personal goals. Provide advice, education, support and counseling about physical activity/exercise needs.;Develop an individualized exercise prescription for aerobic and resistive training based on initial evaluation findings, risk stratification, comorbidities and participant's personal goals.      Expected Outcomes Short Term: Attend rehab on a regular basis to increase amount of physical activity.;Long Term: Add in home exercise to make exercise part of routine and to increase amount of physical activity.;Long Term: Exercising regularly at least 3-5 days a week. Short Term: Attend rehab on a regular basis to increase amount of physical activity.;Long Term: Add in home exercise to make exercise part of routine and to increase amount of physical activity.;Long Term: Exercising regularly at least 3-5 days a week.      Increase Strength and Stamina Yes Yes      Intervention Provide advice, education, support and counseling about physical activity/exercise needs.;Develop an individualized exercise prescription for aerobic and resistive training based on initial evaluation findings, risk stratification, comorbidities and participant's personal goals. Provide advice, education, support and counseling about physical activity/exercise needs.;Develop an individualized exercise prescription for  aerobic and resistive training based on initial evaluation findings, risk stratification, comorbidities and participant's personal goals.      Expected Outcomes Short Term: Increase workloads from initial exercise prescription for resistance, speed, and METs.;Short Term: Perform resistance training exercises routinely during rehab and add in resistance training at home;Long Term: Improve cardiorespiratory fitness, muscular endurance and strength as measured by increased METs and functional capacity (6MWT) Short Term: Increase workloads from initial exercise prescription for resistance, speed, and METs.;Short Term: Perform resistance training exercises routinely during rehab and add in resistance training at home;Long Term: Improve cardiorespiratory fitness, muscular endurance and strength as measured by increased METs and functional capacity (6MWT)      Able to understand and use rate of perceived exertion (RPE) scale Yes Yes      Intervention Provide education and explanation on how to use RPE scale Provide education and explanation on how to use RPE scale      Expected Outcomes Short Term: Able to use RPE daily in rehab to express subjective intensity level;Long Term:  Able to use RPE to guide intensity level when exercising independently Short Term: Able to use RPE daily in rehab to express subjective intensity level;Long Term:  Able to use RPE to guide intensity level when exercising independently      Able to understand and use Dyspnea scale Yes Yes      Intervention Provide education and explanation on how to use Dyspnea scale Provide education and explanation on how to use Dyspnea scale      Expected Outcomes Short Term: Able to use Dyspnea scale daily in rehab to express subjective sense of shortness of  breath during exertion;Long Term: Able to use Dyspnea scale to guide intensity level when exercising independently Short Term: Able to use Dyspnea scale daily in rehab to express subjective sense of  shortness of breath during exertion;Long Term: Able to use Dyspnea scale to guide intensity level when exercising independently      Knowledge and understanding of Target Heart Rate Range (THRR) Yes Yes      Intervention Provide education and explanation of THRR including how the numbers were predicted and where they are located for reference Provide education and explanation of THRR including how the numbers were predicted and where they are located for reference      Expected Outcomes Short Term: Able to state/look up THRR;Short Term: Able to use daily as guideline for intensity in rehab;Long Term: Able to use THRR to govern intensity when exercising independently Short Term: Able to state/look up THRR;Short Term: Able to use daily as guideline for intensity in rehab;Long Term: Able to use THRR to govern intensity when exercising independently      Expected Outcomes -- --      Understanding of Exercise Prescription Yes Yes      Intervention Provide education, explanation, and written materials on patient's individual exercise prescription Provide education, explanation, and written materials on patient's individual exercise prescription      Expected Outcomes Short Term: Able to explain program exercise prescription;Long Term: Able to explain home exercise prescription to exercise independently Short Term: Able to explain program exercise prescription;Long Term: Able to explain home exercise prescription to exercise independently               Exercise Goals Re-Evaluation :  Exercise Goals Re-Evaluation     Row Name 05/13/21 0946             Exercise Goal Re-Evaluation   Exercise Goals Review Increase Physical Activity;Increase Strength and Stamina;Able to understand and use rate of perceived exertion (RPE) scale;Able to understand and use Dyspnea scale;Knowledge and understanding of Target Heart Rate Range (THRR);Understanding of Exercise Prescription       Comments Edwin is scheduled to  begin exercise this week. Will monitor and progress as able.       Expected Outcomes Through exercise at rehab and home, the patient will decrease shortness of breath with daily activities and feel confident in carrying out an exercise regimen at home.                Discharge Exercise Prescription (Final Exercise Prescription Changes):  Exercise Prescription Changes - 05/14/21 1200       Response to Exercise   Blood Pressure (Admit) 130/72    Blood Pressure (Exercise) 124/62    Blood Pressure (Exit) 108/60    Heart Rate (Admit) 69 bpm    Heart Rate (Exercise) 92 bpm    Heart Rate (Exit) 75 bpm    Oxygen Saturation (Admit) 98 %    Oxygen Saturation (Exercise) 97 %    Oxygen Saturation (Exit) 98 %    Rating of Perceived Exertion (Exercise) 11    Perceived Dyspnea (Exercise) 1    Duration Continue with 30 min of aerobic exercise without signs/symptoms of physical distress.    Intensity --   40-80% HRR     Resistance Training   Training Prescription Yes    Weight Blue bands    Reps 10-15      Treadmill   MPH 1.8    Grade 1    Minutes 15    METs 2.63  Recumbant Bike   Level 2    Watts 36    Minutes 15    METs 3             Nutrition:  Target Goals: Understanding of nutrition guidelines, daily intake of sodium '1500mg'$ , cholesterol '200mg'$ , calories 30% from fat and 7% or less from saturated fats, daily to have 5 or more servings of fruits and vegetables.  Biometrics:    Nutrition Therapy Plan and Nutrition Goals:   Nutrition Assessments:  MEDIFICTS Score Key: ?70 Need to make dietary changes  40-70 Heart Healthy Diet ? 40 Therapeutic Level Cholesterol Diet   Picture Your Plate Scores: <85 Unhealthy dietary pattern with much room for improvement. 41-50 Dietary pattern unlikely to meet recommendations for good health and room for improvement. 51-60 More healthful dietary pattern, with some room for improvement.  >60 Healthy dietary pattern,  although there may be some specific behaviors that could be improved.    Nutrition Goals Re-Evaluation:   Nutrition Goals Discharge (Final Nutrition Goals Re-Evaluation):   Psychosocial: Target Goals: Acknowledge presence or absence of significant depression and/or stress, maximize coping skills, provide positive support system. Participant is able to verbalize types and ability to use techniques and skills needed for reducing stress and depression.  Initial Review & Psychosocial Screening:  Initial Psych Review & Screening - 04/29/21 0944       Initial Review   Current issues with None Identified      Family Dynamics   Good Support System? Yes   wife   Comments No concerns identified at this time      Barriers   Psychosocial barriers to participate in program There are no identifiable barriers or psychosocial needs.      Screening Interventions   Interventions Encouraged to exercise             Quality of Life Scores:  Scores of 19 and below usually indicate a poorer quality of life in these areas.  A difference of  2-3 points is a clinically meaningful difference.  A difference of 2-3 points in the total score of the Quality of Life Index has been associated with significant improvement in overall quality of life, self-image, physical symptoms, and general health in studies assessing change in quality of life.  PHQ-9: Recent Review Flowsheet Data     Depression screen [facility] 2/9 04/29/2021 04/29/2021   Decreased Interest 0 0   Down, Depressed, Hopeless 0 0   PHQ - 2 Score 0 0   Altered sleeping 0 -   Tired, decreased energy 0 -   Change in appetite 0 -   Feeling bad or failure about yourself  0 -   Trouble concentrating 0 -   Moving slowly or fidgety/restless 0 -   Suicidal thoughts 0 -   PHQ-9 Score 0 -   Difficult doing work/chores Not difficult at all -      Interpretation of Total Score  Total Score Depression Severity:  1-4 = Minimal depression, 5-9 =  Mild depression, 10-14 = Moderate depression, 15-19 = Moderately severe depression, 20-27 = Severe depression   Psychosocial Evaluation and Intervention:  Psychosocial Evaluation - 04/29/21 0947       Psychosocial Evaluation & Interventions   Interventions Encouraged to exercise with the program and follow exercise prescription    Comments No concerns identified    Expected Outcomes To continue to be free of psychosocial concerns while participating in pulmonary rehab.    Continue Psychosocial Services  No Follow up required             Psychosocial Re-Evaluation:  Psychosocial Re-Evaluation     [place] Name 05/14/21 1201             Psychosocial Re-Evaluation   Current issues with None Identified       Comments Janard has attended only one session and can not evaluate psychosocial barries at this time.       Expected Outcomes Will continue to monitor as Penny progresses in the PR program.       Interventions Stress management education;Encouraged to attend Cardiac Rehabilitation for the exercise;Encouraged to attend Pulmonary Rehabilitation for the exercise       Continue Psychosocial Services  No Follow up required                Psychosocial Discharge (Final Psychosocial Re-Evaluation):  Psychosocial Re-Evaluation - 05/14/21 1201       Psychosocial Re-Evaluation   Current issues with None Identified    Comments Fredrico has attended only one session and can not evaluate psychosocial barries at this time.    Expected Outcomes Will continue to monitor as Kaoru progresses in the PR program.    Interventions Stress management education;Encouraged to attend Cardiac Rehabilitation for the exercise;Encouraged to attend Pulmonary Rehabilitation for the exercise    Continue Psychosocial Services  No Follow up required             Education: Education Goals: Education classes will be provided on a weekly basis, covering required topics. Participant will state  understanding/return demonstration of topics presented.  Learning Barriers/Preferences:  Learning Barriers/Preferences - 04/29/21 1002       Learning Barriers/Preferences   Learning Barriers None    Learning Preferences Computer/Internet;Group Instruction;Individual Instruction;Pictoral;Skilled Demonstration;Verbal Instruction;Video;Written Material             Education Topics: Risk Factor Reduction:  -Group instruction that is supported by a PowerPoint presentation. Instructor discusses the definition of a risk factor, different risk factors for pulmonary disease, and how the heart and lungs work together.   Flowsheet Row PULMONARY REHAB OTHER RESPIRATORY from 05/16/2021 in [place]  Date 05/16/21  Educator Remo Lipps  [Handout]       Nutrition for Pulmonary Patient:  -Group instruction provided by PowerPoint slides, verbal discussion, and written materials to support subject matter. The instructor gives an explanation and review of healthy diet recommendations, which includes a discussion on weight management, recommendations for fruit and vegetable consumption, as well as protein, fluid, caffeine, fiber, sodium, sugar, and alcohol. Tips for eating when patients are short of breath are discussed.   Pursed Lip Breathing:  -Group instruction that is supported by demonstration and informational handouts. Instructor discusses the benefits of pursed lip and diaphragmatic breathing and detailed demonstration on how to preform both.     Oxygen Safety:  -Group instruction provided by PowerPoint, verbal discussion, and written material to support subject matter. There is an overview of "What is Oxygen" and "Why do we need it".  Instructor also reviews how to create a safe environment for oxygen use, the importance of using oxygen as prescribed, and the risks of noncompliance. There is a brief discussion on traveling with oxygen and resources the patient may  utilize.   Oxygen Equipment:  -Group instruction provided by [facility] utilizing handouts, written materials, and equipment demonstrations.   Signs and Symptoms:  -Group instruction provided by written material and verbal discussion to support subject matter.  Warning signs and symptoms of infection, stroke, and heart attack are reviewed and when to call the physician/911 reinforced. Tips for preventing the spread of infection discussed.   Advanced Directives:  -Group instruction provided by verbal instruction and written material to support subject matter. Instructor reviews Advanced Directive laws and proper instruction for filling out document.   Pulmonary Video:  -Group video education that reviews the importance of medication and oxygen compliance, exercise, good nutrition, pulmonary hygiene, and pursed lip and diaphragmatic breathing for the pulmonary patient.   Exercise for the Pulmonary Patient:  -Group instruction that is supported by a PowerPoint presentation. Instructor discusses benefits of exercise, core components of exercise, frequency, duration, and intensity of an exercise routine, importance of utilizing pulse oximetry during exercise, safety while exercising, and options of places to exercise outside of rehab.     Pulmonary Medications:  -Verbally interactive group education provided by instructor with focus on inhaled medications and proper administration.   Anatomy and Physiology of the Respiratory System and Intimacy:  -Group instruction provided by PowerPoint, verbal discussion, and written material to support subject matter. Instructor reviews respiratory cycle and anatomical components of the respiratory system and their functions. Instructor also reviews differences in obstructive and restrictive respiratory diseases with examples of each. Intimacy, Sex, and Sexuality differences are reviewed with a discussion on how relationships can change when diagnosed  with pulmonary disease. Common sexual concerns are reviewed.   MD DAY -A group question and answer session with a medical doctor that allows participants to ask questions that relate to their pulmonary disease state.   OTHER EDUCATION -Group or individual verbal, written, or video instructions that support the educational goals of the pulmonary rehab program.   Holiday Eating Survival Tips:  -Group instruction provided by PowerPoint slides, verbal discussion, and written materials to support subject matter. The instructor gives patients tips, tricks, and techniques to help them not only survive but enjoy the holidays despite the onslaught of food that accompanies the holidays.   Knowledge Questionnaire Score:  Knowledge Questionnaire Score - 04/29/21 0953       Knowledge Questionnaire Score   Pre Score 16/18             Core Components/Risk Factors/Patient Goals at Admission:  Personal Goals and Risk Factors at Admission - 04/29/21 0951       Core Components/Risk Factors/Patient Goals on Admission   Improve shortness of breath with ADL's Yes    Intervention Provide education, individualized exercise plan and daily activity instruction to help decrease symptoms of SOB with activities of daily living.    Expected Outcomes Short Term: Improve cardiorespiratory fitness to achieve a reduction of symptoms when performing ADLs;Long Term: Be able to perform more ADLs without symptoms or delay the onset of symptoms    Increase knowledge of respiratory medications and ability to use respiratory devices properly  Yes    Intervention Provide education and demonstration as needed of appropriate use of medications, inhalers, and oxygen therapy.    Expected Outcomes Short Term: Achieves understanding of medications use. Understands that oxygen is a medication prescribed by physician. Demonstrates appropriate use of inhaler and oxygen therapy.;Long Term: Maintain appropriate use of medications,  inhalers, and oxygen therapy.             Core Components/Risk Factors/Patient Goals Review:   Goals and Risk Factor Review     Row Name 05/14/21 1215             Core Components/Risk Factors/Patient Goals  Review   Personal Goals Review Improve shortness of breath with ADL's;Develop more efficient breathing techniques such as purse lipped breathing and diaphragmatic breathing and practicing self-pacing with activity.;Increase knowledge of respiratory medications and ability to use respiratory devices properly.       Review Mancil has attended only one session, to soon to address core compents at this time.       Expected Outcomes See initial core components                Core Components/Risk Factors/Patient Goals at Discharge (Final Review):   Goals and Risk Factor Review - 05/14/21 1215       Core Components/Risk Factors/Patient Goals Review   Personal Goals Review Improve shortness of breath with ADL's;Develop more efficient breathing techniques such as purse lipped breathing and diaphragmatic breathing and practicing self-pacing with activity.;Increase knowledge of respiratory medications and ability to use respiratory devices properly.    Review Daqwan has attended only one session, to soon to address core compents at this time.    Expected Outcomes See initial core components             ITP Comments:   Comments: ITP REVIEW Pt is making expected progress toward pulmonary rehab goals after completing 2 sessions. Recommend continued exercise, life style modification, education, and utilization of breathing techniques to increase stamina and strength and decrease shortness of breath with exertion.  Dr. Rodman Pickle is Medical Director for Pulmonary Rehab at [facility].

## 2021-05-23 ENCOUNTER — Other Ambulatory Visit: Payer: Self-pay

## 2021-05-23 ENCOUNTER — Encounter (HOSPITAL_COMMUNITY)
Admission: RE | Admit: 2021-05-23 | Discharge: 2021-05-23 | Disposition: A | Discharge status: Home / Self Care | Payer: Medicare PPO | Source: Ambulatory Visit | Attending: Pulmonary Disease | Admitting: Pulmonary Disease

## 2021-05-23 DIAGNOSIS — J849 Interstitial pulmonary disease, unspecified: Secondary | ICD-10-CM | POA: Diagnosis not present

## 2021-05-23 NOTE — Progress Notes (Signed)
Daily Session Note  Patient Details  Name: Javier Phillips MRN: [id number] Date of Birth: [birth year] Referring Provider:   April Manson Pulmonary Rehab Walk Test from 04/29/2021 in [place]  Referring Provider Mannam       Encounter Date: 05/23/2021  Check In:  Session Check In - 05/23/21 1126       Check-In   Supervising physician immediately available to respond to emergencies [facility] immediately available    Physician(s) Dr. Broadus John    Location [facility]    Staff Present Rodney Langton, Cathleen Fears, MS, ACSM-CEP, Exercise Physiologist    Virtual Visit No    Medication changes reported     No    Fall or balance concerns reported    No    Tobacco Cessation No Change    Warm-up and Cool-down Performed as group-led instruction    Resistance Training Performed Yes    VAD Patient? No    PAD/SET Patient? No      Pain Assessment   Currently in Pain? No/denies    Multiple Pain Sites No             Capillary Blood Glucose: No results found for this or any previous visit (from the past 24 hour(s)).    Social History   Tobacco Use  Smoking Status Never  Smokeless Tobacco Never    Goals Met:  Exercise tolerated well No report of concerns or symptoms today Strength training completed today  Goals Unmet:  Not Applicable  Comments: Service time is from 1019 to [place]    Dr. Rodman Pickle is Medical Director for Pulmonary Rehab at [facility].

## 2021-05-27 DIAGNOSIS — D649 Anemia, unspecified: Secondary | ICD-10-CM | POA: Diagnosis not present

## 2021-05-27 DIAGNOSIS — Z131 Encounter for screening for diabetes mellitus: Secondary | ICD-10-CM | POA: Diagnosis not present

## 2021-05-28 ENCOUNTER — Other Ambulatory Visit: Payer: Self-pay

## 2021-05-28 ENCOUNTER — Encounter (HOSPITAL_COMMUNITY)
Admission: RE | Admit: 2021-05-28 | Discharge: 2021-05-28 | Disposition: A | Discharge status: Home / Self Care | Payer: Medicare PPO | Source: Ambulatory Visit | Attending: Pulmonary Disease | Admitting: Pulmonary Disease

## 2021-05-28 VITALS — Wt 133.6 lb

## 2021-05-28 DIAGNOSIS — J849 Interstitial pulmonary disease, unspecified: Secondary | ICD-10-CM | POA: Diagnosis not present

## 2021-05-29 DIAGNOSIS — R634 Abnormal weight loss: Secondary | ICD-10-CM | POA: Diagnosis not present

## 2021-05-29 DIAGNOSIS — Z131 Encounter for screening for diabetes mellitus: Secondary | ICD-10-CM | POA: Diagnosis not present

## 2021-05-29 DIAGNOSIS — Z0000 Encounter for general adult medical examination without abnormal findings: Secondary | ICD-10-CM | POA: Diagnosis not present

## 2021-05-29 DIAGNOSIS — D649 Anemia, unspecified: Secondary | ICD-10-CM | POA: Diagnosis not present

## 2021-05-30 ENCOUNTER — Other Ambulatory Visit: Payer: Self-pay

## 2021-05-30 ENCOUNTER — Encounter (HOSPITAL_COMMUNITY)
Admission: RE | Admit: 2021-05-30 | Discharge: 2021-05-30 | Disposition: A | Discharge status: Home / Self Care | Payer: Medicare PPO | Source: Ambulatory Visit | Attending: Pulmonary Disease | Admitting: Pulmonary Disease

## 2021-05-30 DIAGNOSIS — J849 Interstitial pulmonary disease, unspecified: Secondary | ICD-10-CM

## 2021-05-30 NOTE — Progress Notes (Signed)
Daily Session Note ° °Patient Details  °Name: Javier Phillips °MRN: [id number] °Date of Birth: 10/02/1945 °Referring Provider:   °Flowsheet Row Pulmonary Rehab Walk Test from 04/29/2021 in [facility]  °Referring Provider Mannam  ° °  ° ° °Encounter Date: 05/30/2021 ° °Check In: ° Session Check In - 05/30/21 1202   ° °  ° Check-In  ° Supervising physician immediately available to respond to emergencies [facility] immediately available   ° Physician(s) Dr. Ramesh   ° Location [facility]   ° Staff Present Kaylee Davis, MS, ACSM-CEP, Exercise Physiologist;Lisa Hughes, RN;Portia Payne, RN, BSN;David Makemson, MS, ACSM-CEP, CCRP, Exercise Physiologist   ° Virtual Visit No   ° Medication changes reported     No   ° Fall or balance concerns reported    No   ° Tobacco Cessation No Change   ° Warm-up and Cool-down Performed as group-led instruction   ° Resistance Training Performed Yes   ° VAD Patient? No   ° PAD/SET Patient? No   °  ° Pain Assessment  ° Currently in Pain? No/denies   ° Multiple Pain Sites No   ° °  °  ° °  ° ° °Capillary Blood Glucose: °No results found for this or any previous visit (from the past 24 hour(s)). ° ° ° °Social History  ° °Tobacco Use  °Smoking Status Never  °Smokeless Tobacco Never  ° ° °Goals Met:  °Independence with exercise equipment °Exercise tolerated well °No report of concerns or symptoms today °Strength training completed today ° °Goals Unmet:  °Not Applicable ° °Comments: Service time is from 1015 to 1131.  ° ° °Dr. Jane Ellison is Medical Director for Pulmonary Rehab at [facility].  °

## 2021-05-30 NOTE — Progress Notes (Signed)
Daily Session Note  Patient Details  Name: Javier Phillips MRN: [id number] Date of Birth: May 16, 1946 Referring Provider:   April Manson Pulmonary Rehab Walk Test from 04/29/2021 in [place]  Referring Provider Mannam       Encounter Date: 05/28/2021  Check In:   Capillary Blood Glucose: No results found for this or any previous visit (from the past 24 hour(s)).    Social History   Tobacco Use  Smoking Status Never  Smokeless Tobacco Never    Goals Met:  Independence with exercise equipment Exercise tolerated well No report of concerns or symptoms today Strength training completed today  Goals Unmet:  Not Applicable  Comments: Service time is from 1025 to 1139. Late entry 05/30/21   Dr. Rodman Pickle is Medical Director for Pulmonary Rehab at [facility].

## 2021-06-04 ENCOUNTER — Other Ambulatory Visit: Payer: Self-pay

## 2021-06-04 ENCOUNTER — Encounter (HOSPITAL_COMMUNITY)
Admission: RE | Admit: 2021-06-04 | Discharge: 2021-06-04 | Disposition: A | Discharge status: Home / Self Care | Payer: Medicare PPO | Source: Ambulatory Visit | Attending: Pulmonary Disease | Admitting: Pulmonary Disease

## 2021-06-04 DIAGNOSIS — J849 Interstitial pulmonary disease, unspecified: Secondary | ICD-10-CM

## 2021-06-04 NOTE — Progress Notes (Signed)
Daily Session Note  Patient Details  Name: WILBURN KEIR MRN: [id number] Date of Birth: September 20, 1945 Referring Provider:   April Manson Pulmonary Rehab Walk Test from 04/29/2021 in [place]  Referring Provider Mannam       Encounter Date: 06/04/2021  Check In:  Session Check In - 06/04/21 1126       Check-In   Supervising physician immediately available to respond to emergencies [facility] immediately available    Physician(s) Dr. Maren Beach    [facility]    Staff Present Rodney Langton, Cathleen Fears, MS, ACSM-CEP, Exercise Physiologist;David Lilyan Punt, MS, ACSM-CEP, CCRP, Exercise Physiologist    Virtual Visit No    Medication changes reported     No    Fall or balance concerns reported    No    Tobacco Cessation No Change    Warm-up and Cool-down Performed as group-led instruction    Resistance Training Performed Yes    VAD Patient? No    PAD/SET Patient? No      Pain Assessment   Currently in Pain? No/denies    Multiple Pain Sites No             Capillary Blood Glucose: No results found for this or any previous visit (from the past 24 hour(s)).    Social History   Tobacco Use  Smoking Status Never  Smokeless Tobacco Never    Goals Met:  Exercise tolerated well No report of concerns or symptoms today Strength training completed today  Goals Unmet:  Not Applicable  Comments: Service time is from 1016 to [place]    Dr. Rodman Pickle is Medical Director for Pulmonary Rehab at [facility].

## 2021-06-06 ENCOUNTER — Encounter (HOSPITAL_COMMUNITY)
Admission: RE | Admit: 2021-06-06 | Discharge: 2021-06-06 | Disposition: A | Discharge status: Home / Self Care | Payer: Medicare PPO | Source: Ambulatory Visit | Attending: Pulmonary Disease | Admitting: Pulmonary Disease

## 2021-06-06 ENCOUNTER — Other Ambulatory Visit: Payer: Self-pay

## 2021-06-06 DIAGNOSIS — J849 Interstitial pulmonary disease, unspecified: Secondary | ICD-10-CM | POA: Diagnosis not present

## 2021-06-06 DIAGNOSIS — N401 Enlarged prostate with lower urinary tract symptoms: Secondary | ICD-10-CM | POA: Diagnosis not present

## 2021-06-06 DIAGNOSIS — N3010 Interstitial cystitis (chronic) without hematuria: Secondary | ICD-10-CM | POA: Diagnosis not present

## 2021-06-06 DIAGNOSIS — R351 Nocturia: Secondary | ICD-10-CM | POA: Diagnosis not present

## 2021-06-06 NOTE — Progress Notes (Signed)
Daily Session Note  Patient Details  Name: Javier Phillips MRN: [id number] Date of Birth: [birth year] Referring Provider:   April Manson Pulmonary Rehab Walk Test from 04/29/2021 in [place]  Referring Provider Mannam       Encounter Date: 06/06/2021  Check In:  Session Check In - 06/06/21 1123       Check-In   Supervising physician immediately available to respond to emergencies [facility] immediately available    Physician(s) Dr. Maryland Pink    [facility]    Staff Present Rose[place] Poles, RN, BSN;Carlette Wilber Oliphant, RN, Quentin Ore, MS, ACSM-CEP, Exercise Physiologist;Divya Munshi Ysidro Evert, RN    Virtual Visit No    Medication changes reported     No    Fall or balance concerns reported    No    Tobacco Cessation No Change    Warm-up and Cool-down Performed as group-led instruction    Resistance Training Performed Yes    VAD Patient? No    PAD/SET Patient? No      Pain Assessment   Currently in Pain? No/denies    Multiple Pain Sites No             Capillary Blood Glucose: No results found for this or any previous visit (from the past 24 hour(s)).    Social History   Tobacco Use  Smoking Status Never  Smokeless Tobacco Never    Goals Met:  Exercise tolerated well No report of concerns or symptoms today Strength training completed today  Goals Unmet:  Not Applicable  Comments: Service time is from 1015 to [place]    Dr. Rodman Pickle is Medical Director for Pulmonary Rehab at [facility].

## 2021-06-11 ENCOUNTER — Encounter (HOSPITAL_COMMUNITY)
Admission: RE | Admit: 2021-06-11 | Discharge: 2021-06-11 | Disposition: A | Discharge status: Home / Self Care | Payer: Medicare PPO | Source: Ambulatory Visit | Attending: Pulmonary Disease | Admitting: Pulmonary Disease

## 2021-06-11 ENCOUNTER — Other Ambulatory Visit: Payer: Self-pay

## 2021-06-11 VITALS — Wt 134.3 lb

## 2021-06-11 DIAGNOSIS — J849 Interstitial pulmonary disease, unspecified: Secondary | ICD-10-CM | POA: Diagnosis not present

## 2021-06-11 NOTE — Progress Notes (Signed)
Daily Session Note  Patient Details  Name: Javier Phillips MRN: [id number] Date of Birth: [birth year] Referring Provider:   April Manson Pulmonary Rehab Walk Test from 04/29/2021 in [place]  Referring Provider Mannam       Encounter Date: 06/11/2021  Check In:  Session Check In - 06/11/21 1115       Check-In   Supervising physician immediately available to respond to emergencies [facility] immediately available    Physician(s) Dr. Maryland Pink    [facility]    Staff Present [place] Poles, RN, Quentin Ore, MS, ACSM-CEP, Exercise Physiologist;Wm Fruchter Ysidro Evert, RN    Virtual Visit No    Medication changes reported     No    Fall or balance concerns reported    No    Tobacco Cessation No Change    Warm-up and Cool-down Performed as group-led instruction    Resistance Training Performed Yes    VAD Patient? No    PAD/SET Patient? No      Pain Assessment   Currently in Pain? No/denies    Multiple Pain Sites No             Capillary Blood Glucose: No results found for this or any previous visit (from the past 24 hour(s)).   Exercise Prescription Changes - 06/11/21 1200       Response to Exercise   Blood Pressure (Admit) 114/62    Blood Pressure (Exercise) 124/70    Blood Pressure (Exit) 114/70    Heart Rate (Admit) 74 bpm    Heart Rate (Exercise) 98 bpm    Heart Rate (Exit) 82 bpm    Oxygen Saturation (Admit) 100 %    Oxygen Saturation (Exercise) 93 %    Oxygen Saturation (Exit) 82 %    Rating of Perceived Exertion (Exercise) 12    Perceived Dyspnea (Exercise) 2    Duration Continue with 30 min of aerobic exercise without signs/symptoms of physical distress.    Intensity THRR unchanged      Progression   Progression Continue to progress workloads to maintain intensity without signs/symptoms of physical distress.      Resistance Training   Training Prescription Yes    Weight Blue bands     Reps 10-15    Time 10 Minutes      Treadmill   MPH 2.3    Grade 3.5    Minutes 15      Recumbant Bike   Level 3.7    Minutes 15    METs 4.9             Social History   Tobacco Use  Smoking Status Never  Smokeless Tobacco Never    Goals Met:  Exercise tolerated well No report of concerns or symptoms today Strength training completed today  Goals Unmet:  Not Applicable  Comments: Service time is from 1020 to [place]    Dr. Rodman Pickle is Medical Director for Pulmonary Rehab at [facility].

## 2021-06-13 ENCOUNTER — Other Ambulatory Visit: Payer: Self-pay

## 2021-06-13 ENCOUNTER — Encounter (HOSPITAL_COMMUNITY)
Admission: RE | Admit: 2021-06-13 | Discharge: 2021-06-13 | Disposition: A | Discharge status: Home / Self Care | Payer: Medicare PPO | Source: Ambulatory Visit | Attending: Pulmonary Disease | Admitting: Pulmonary Disease

## 2021-06-13 DIAGNOSIS — J849 Interstitial pulmonary disease, unspecified: Secondary | ICD-10-CM

## 2021-06-13 NOTE — Progress Notes (Signed)
Daily Session Note  Patient Details  Name: Javier Phillips MRN: [id number] Date of Birth: 12-17-1945 Referring Provider:   April Manson Pulmonary Rehab Walk Test from 04/29/2021 in [place]  Referring Provider Mannam       Encounter Date: 06/13/2021  Check In:  Session Check In - 06/13/21 1134       Check-In   Supervising physician immediately available to respond to emergencies [facility] immediately available    Physician(s) Dr. Sloan Leiter    [facility]    Staff Present Rodney Langton, Cathleen Fears, MS, ACSM-CEP, Exercise Physiologist;David Lilyan Punt, MS, ACSM-CEP, CCRP, Exercise Physiologist    Virtual Visit No    Medication changes reported     No    Fall or balance concerns reported    No    Tobacco Cessation No Change    Warm-up and Cool-down Performed as group-led instruction    Resistance Training Performed Yes    VAD Patient? No    PAD/SET Patient? No      Pain Assessment   Currently in Pain? No/denies    Multiple Pain Sites No             Capillary Blood Glucose: No results found for this or any previous visit (from the past 24 hour(s)).    Social History   Tobacco Use  Smoking Status Never  Smokeless Tobacco Never    Goals Met:  Exercise tolerated well No report of concerns or symptoms today Strength training completed today  Goals Unmet:  Not Applicable  Comments: Service time is from 1027 to [place]    Dr. Rodman Pickle is Medical Director for Pulmonary Rehab at [facility].

## 2021-06-18 ENCOUNTER — Encounter (HOSPITAL_COMMUNITY)
Admission: RE | Admit: 2021-06-18 | Discharge: 2021-06-18 | Disposition: A | Discharge status: Home / Self Care | Payer: Medicare PPO | Source: Ambulatory Visit | Attending: Pulmonary Disease | Admitting: Pulmonary Disease

## 2021-06-18 ENCOUNTER — Other Ambulatory Visit: Payer: Self-pay

## 2021-06-18 DIAGNOSIS — J849 Interstitial pulmonary disease, unspecified: Secondary | ICD-10-CM | POA: Diagnosis not present

## 2021-06-18 NOTE — Progress Notes (Signed)
Daily Session Note  Patient Details  Name: MACAULEY MOSSBERG MRN: [id number] Date of Birth: 26-Feb-1946 Referring Provider:   April Manson Pulmonary Rehab Walk Test from 04/29/2021 in [place]  Referring Provider Mannam       Encounter Date: 06/18/2021  Check In:  Session Check In - 06/18/21 1206       Check-In   Supervising physician immediately available to respond to emergencies [facility] immediately available    Physician(s) Dr. Arbutus Ped    [facility]    Staff Present [place] Poles, RN, Quentin Ore, MS, ACSM-CEP, Exercise Physiologist;Selda Jalbert Ysidro Evert, RN    Virtual Visit No    Medication changes reported     No    Fall or balance concerns reported    No    Tobacco Cessation No Change    Warm-up and Cool-down Performed as group-led instruction    Resistance Training Performed Yes    VAD Patient? No    PAD/SET Patient? No      Pain Assessment   Currently in Pain? No/denies    Multiple Pain Sites No             Capillary Blood Glucose: No results found for this or any previous visit (from the past 24 hour(s)).   Exercise Prescription Changes - 06/18/21 1100       Home Exercise Plan   Plans to continue exercise at Home (comment)   walks outside   Frequency Add 1 additional day to program exercise sessions.   Meets minimum requirement   Initial Home Exercises Provided 06/18/21             Social History   Tobacco Use  Smoking Status Never  Smokeless Tobacco Never    Goals Met:  Exercise tolerated well No report of concerns or symptoms today Strength training completed today  Goals Unmet:  Not Applicable  Comments: Service time is from 1020 to [place]    Dr. Rodman Pickle is Medical Director for Pulmonary Rehab at [facility].

## 2021-06-18 NOTE — Progress Notes (Signed)
Home Exercise Prescription I have reviewed a Home Exercise Prescription with Kiah L Ast. Jushua is currently exercising at home. Mauricio currently walks outside with his wife 4-5 non-rehab days/wk for 45 min/day. He also performs the resistance and flexibility training at home inconsistently. I discussed trying to add in 1 non-rehab day of resistance training. Romello also does balance training at home. I discussed safe exercise with Keola and trying to not overload himself. He voiced understanding of what I said. I am confident in Wendal carrying out an exercise regimen at home. Daylon stated that they understand the exercise prescription. The patient stated that their goals were to maintain current health status. We reviewed exercise guidelines, target heart rate during exercise, RPE Scale, weather conditions, endpoints for exercise, warmup and cool down. The patient is encouraged to come to me with any questions. I will continue to follow up with the patient to assist them with progression and safety.    Joya San, MS, ACSM-CEP 06/18/2021 11:57 AM

## 2021-06-19 NOTE — Progress Notes (Signed)
Pulmonary Individual Treatment Plan  Patient Details  Name: Javier Phillips MRN: [id number] Date of Birth: 13-May-1946 Referring Provider:   April Manson Pulmonary Rehab Walk Test from 04/29/2021 in [place]  Referring Provider Mannam       Initial Encounter Date:  Flowsheet Row Pulmonary Rehab Walk Test from 04/29/2021 in [place]  Date 04/29/21       Visit Diagnosis: Interstitial lung disease ([place])  Patient's Home Medications on Admission:   Current Outpatient Medications:    acetaminophen (TYLENOL) 325 MG tablet, Take 650 mg by mouth every 6 (six) hours as needed. For fever, Disp: , Rfl:    Albuterol Sulfate (PROAIR RESPICLICK) 735 (90 Base) MCG/ACT AEPB, , Disp: , Rfl:    amitriptyline (ELAVIL) 10 MG tablet, 1 tablet at bedtime., Disp: , Rfl:    azithromycin (ZITHROMAX) 250 MG tablet, Take 2 tablets on first day, then 1 tablet daily until finished, Disp: 6 tablet, Rfl: 0   cetirizine (ZYRTEC) 10 MG tablet, Take 10 mg by mouth daily., Disp: , Rfl:    famotidine (PEPCID) 20 MG tablet, Take 20 mg by mouth daily as needed., Disp: , Rfl:    fluticasone (FLONASE) 50 MCG/ACT nasal spray, Place into both nostrils daily as needed., Disp: , Rfl:    oxybutynin (DITROPAN-XL) 10 MG 24 hr tablet, Take 10 mg by mouth daily., Disp: , Rfl:    Probiotic Product (PROBIOTIC PO), Take by mouth daily., Disp: , Rfl:    Tamsulosin HCl (FLOMAX) 0.4 MG CAPS, Take 0.4 mg by mouth daily., Disp: , Rfl:   Past Medical History: Past Medical History:  Diagnosis Date   Enlarged prostate    Heart murmur    Diagnosis of MVP years ago   Interstitial cystitis    Interstitial lung disease ([place])     Tobacco Use: Social History   Tobacco Use  Smoking Status Never  Smokeless Tobacco Never    Labs: Recent Review Flowsheet Data   There is no flowsheet data to display.     Capillary Blood Glucose: No results found for:  GLUCAP   Pulmonary Assessment Scores:  Pulmonary Assessment Scores     Row Name 04/29/21 0942         ADL UCSD   ADL Phase Entry     SOB Score total 24       CAT Score   CAT Score 7       mMRC Score   mMRC Score 1             UCSD: Self-administered rating of dyspnea associated with activities of daily living (ADLs) 6-point scale (0 = "not at all" to 5 = "maximal or unable to do because of breathlessness")  Scoring Scores range from 0 to 120.  Minimally important difference is 5 units  CAT: CAT can identify the health impairment of COPD patients and is better correlated with disease progression.  CAT has a scoring range of zero to 40. The CAT score is classified into four groups of low (less than 10), medium (10 - 20), high (21-30) and very high (31-40) based on the impact level of disease on health status. A CAT score over 10 suggests significant symptoms.  A worsening CAT score could be explained by an exacerbation, poor medication adherence, poor inhaler technique, or progression of COPD or comorbid conditions.  CAT MCID is 2 points  mMRC: mMRC (Modified Medical Research Council) Dyspnea Scale is used to  assess the degree of baseline functional disability in patients of respiratory disease due to dyspnea. No minimal important difference is established. A decrease in score of 1 point or greater is considered a positive change.   Pulmonary Function Assessment:  Pulmonary Function Assessment - 04/29/21 0937       Breath   Bilateral Breath Sounds Clear   crackles 1/2 up on right and 1/3 up on left   Shortness of Breath No             Exercise Target Goals: Exercise Program Goal: Individual exercise prescription set using results from initial 6 min walk test and THRR while considering  patients activity barriers and safety.   Exercise Prescription Goal: Initial exercise prescription builds to 30-45 minutes a day of aerobic activity, 2-3 days per week.  Home  exercise guidelines will be given to patient during program as part of exercise prescription that the participant will acknowledge.  Activity Barriers & Risk Stratification:  Activity Barriers & Cardiac Risk Stratification - 04/29/21 0935       Activity Barriers & Cardiac Risk Stratification   Activity Barriers Arthritis;Deconditioning;Muscular Weakness;Shortness of Breath             6 Minute Walk:  6 Minute Walk     Row Name 04/29/21 1028         6 Minute Walk   Phase Initial     Distance 1530 feet     Walk Time 6 minutes     # of Rest Breaks 0     MPH 2.9     METS 3.15     RPE 11     Perceived Dyspnea  1     VO2 Peak 11.01     Symptoms No     Resting HR 79 bpm     Resting BP 104/56     Resting Oxygen Saturation  98 %     Exercise Oxygen Saturation  during 6 min walk 94 %     Max Ex. HR 99 bpm     Max Ex. BP 110/60     2 Minute Post BP 102/60       Interval HR   1 Minute HR 76     2 Minute HR 76     3 Minute HR 86     4 Minute HR 89     5 Minute HR 91     6 Minute HR 99     2 Minute Post HR 69     Interval Heart Rate? Yes       Interval Oxygen   Interval Oxygen? Yes     Baseline Oxygen Saturation % 98 %     1 Minute Oxygen Saturation % 97 %     1 Minute Liters of Oxygen 0 L     2 Minute Oxygen Saturation % 85 %  Cold finger     2 Minute Liters of Oxygen 0 L     3 Minute Oxygen Saturation % 96 %     3 Minute Liters of Oxygen 0 L     4 Minute Oxygen Saturation % 95 %     4 Minute Liters of Oxygen 0 L     5 Minute Oxygen Saturation % 96 %     5 Minute Liters of Oxygen 0 L     6 Minute Oxygen Saturation % 94 %     6 Minute Liters of Oxygen 0 L  2 Minute Post Oxygen Saturation % 99 %     2 Minute Post Liters of Oxygen 0 L              Oxygen Initial Assessment:  Oxygen Initial Assessment - 05/13/21 0950       Initial 6 min Walk   Oxygen Used None             Oxygen Re-Evaluation:  Oxygen Re-Evaluation     Row Name 04/29/21  3329 05/13/21 0950 06/12/21 0747         Program Oxygen Prescription   Program Oxygen Prescription None None None       Home Oxygen   Home Oxygen Device -- None None     Sleep Oxygen Prescription -- None None     Home Exercise Oxygen Prescription -- None None     Home Resting Oxygen Prescription -- None None       Goals/Expected Outcomes   Short Term Goals To learn and exhibit compliance with exercise, home and travel O2 prescription;To learn and understand importance of monitoring SPO2 with pulse oximeter and demonstrate accurate use of the pulse oximeter.;To learn and understand importance of maintaining oxygen saturations>88%;To learn and demonstrate proper pursed lip breathing techniques or other breathing techniques. ;To learn and demonstrate proper use of respiratory medications To learn and exhibit compliance with exercise, home and travel O2 prescription;To learn and understand importance of monitoring SPO2 with pulse oximeter and demonstrate accurate use of the pulse oximeter.;To learn and understand importance of maintaining oxygen saturations>88%;To learn and demonstrate proper pursed lip breathing techniques or other breathing techniques. ;To learn and demonstrate proper use of respiratory medications To learn and exhibit compliance with exercise, home and travel O2 prescription;To learn and understand importance of monitoring SPO2 with pulse oximeter and demonstrate accurate use of the pulse oximeter.;To learn and understand importance of maintaining oxygen saturations>88%;To learn and demonstrate proper pursed lip breathing techniques or other breathing techniques. ;To learn and demonstrate proper use of respiratory medications     Long  Term Goals Exhibits compliance with exercise, home  and travel O2 prescription;Verbalizes importance of monitoring SPO2 with pulse oximeter and return demonstration;Maintenance of O2 saturations>88%;Exhibits proper breathing techniques, such as pursed  lip breathing or other method taught during program session;Compliance with respiratory medication;Demonstrates proper use of MDIs Exhibits compliance with exercise, home  and travel O2 prescription;Verbalizes importance of monitoring SPO2 with pulse oximeter and return demonstration;Maintenance of O2 saturations>88%;Exhibits proper breathing techniques, such as pursed lip breathing or other method taught during program session;Compliance with respiratory medication;Demonstrates proper use of MDIs Exhibits compliance with exercise, home  and travel O2 prescription;Verbalizes importance of monitoring SPO2 with pulse oximeter and return demonstration;Maintenance of O2 saturations>88%;Exhibits proper breathing techniques, such as pursed lip breathing or other method taught during program session;Compliance with respiratory medication;Demonstrates proper use of MDIs     Goals/Expected Outcomes Compliance and understanding of oxygen saturation monitoring and breathing techniques to decrease shortness of breath. Compliance and understanding of oxygen saturation monitoring and breathing techniques to decrease shortness of breath. Compliance and understanding of oxygen saturation monitoring and breathing techniques to decrease shortness of breath.              Oxygen Discharge (Final Oxygen Re-Evaluation):  Oxygen Re-Evaluation - 06/12/21 0747       Program Oxygen Prescription   Program Oxygen Prescription None      Home Oxygen   Home Oxygen Device None    Sleep Oxygen Prescription None  Home Exercise Oxygen Prescription None    Home Resting Oxygen Prescription None      Goals/Expected Outcomes   Short Term Goals To learn and exhibit compliance with exercise, home and travel O2 prescription;To learn and understand importance of monitoring SPO2 with pulse oximeter and demonstrate accurate use of the pulse oximeter.;To learn and understand importance of maintaining oxygen saturations>88%;To learn  and demonstrate proper pursed lip breathing techniques or other breathing techniques. ;To learn and demonstrate proper use of respiratory medications    Long  Term Goals Exhibits compliance with exercise, home  and travel O2 prescription;Verbalizes importance of monitoring SPO2 with pulse oximeter and return demonstration;Maintenance of O2 saturations>88%;Exhibits proper breathing techniques, such as pursed lip breathing or other method taught during program session;Compliance with respiratory medication;Demonstrates proper use of MDIs    Goals/Expected Outcomes Compliance and understanding of oxygen saturation monitoring and breathing techniques to decrease shortness of breath.             Initial Exercise Prescription:  Initial Exercise Prescription - 04/29/21 1000       Date of Initial Exercise RX and Referring Provider   Date 04/29/21    Referring Provider Mannam    Expected Discharge Date 07/11/21      Treadmill   MPH 1.8    Grade 1    Minutes 15      Recumbant Bike   Level 2    Watts 22    Minutes 15      Prescription Details   Frequency (times per week) 2    Duration Progress to 30 minutes of continuous aerobic without signs/symptoms of physical distress      Intensity   THRR 40-80% of Max Heartrate 58-116    Ratings of Perceived Exertion 11-13      Progression   Progression Continue to progress workloads to maintain intensity without signs/symptoms of physical distress.      Resistance Training   Training Prescription Yes    Weight Blue bands    Reps 10-15             Perform Capillary Blood Glucose checks as needed.  Exercise Prescription Changes:   Exercise Prescription Changes     Row Name 05/14/21 1200 05/28/21 1200 06/11/21 1200 06/18/21 1100       Response to Exercise   Blood Pressure (Admit) 130/72 112/72 114/62 --    Blood Pressure (Exercise) 124/62 120/60 124/70 --    Blood Pressure (Exit) 108/60 118/66 114/70 --    Heart Rate (Admit)  69 bpm 68 bpm 74 bpm --    Heart Rate (Exercise) 92 bpm 98 bpm 98 bpm --    Heart Rate (Exit) 75 bpm 60 bpm 82 bpm --    Oxygen Saturation (Admit) 98 % 99 % 100 % --    Oxygen Saturation (Exercise) 97 % 92 % 93 % --    Oxygen Saturation (Exit) 98 % 98 % 82 % --    Rating of Perceived Exertion (Exercise) _0 --    Perceived Dyspnea (Exercise) _1 --    Duration Continue with 30 min of aerobic exercise without signs/symptoms of physical distress. Continue with 30 min of aerobic exercise without signs/symptoms of physical distress. Continue with 30 min of aerobic exercise without signs/symptoms of physical distress. --    Intensity --  40-80% HRR THRR unchanged THRR unchanged --      Progression   Progression -- Continue to progress workloads to maintain intensity without  signs/symptoms of physical distress. Continue to progress workloads to maintain intensity without signs/symptoms of physical distress. --      Horticulturist, commercial Prescription Yes Yes Yes --    Weight Blue bands Blue bands Blue bands --    Reps 10-15 10-15 10-15 --    Time -- 10 Minutes 10 Minutes --      Treadmill   MPH 1.8 2 2.3 --    Grade 1 3.5 3.5 --    Minutes _0 --    METs 2.63 3.5 -- --      Recumbant Bike   Level 2 2 3.7 --    Watts 36 -- -- --    Minutes _1 --    METs 3 5.3 4.9 --      Home Exercise Plan   Plans to continue exercise at -- -- -- Home (comment)  walks outside    Frequency -- -- -- Add 1 additional day to program exercise sessions.  Meets minimum requirement    Initial Home Exercises Provided -- -- -- 06/18/21             Exercise Comments:   Exercise Comments     Row Name 05/14/21 1227 06/18/21 1152         Exercise Comments Javier Phillips completed his 1st day of exercise. He exercised for 15 min on the treadmill and recumbent bike. He averaged 2.63 METs at 1.8 mph @ 1.0% on the treadmill and 3.0 METs at level 2 on the recumbent bike. Javier Phillips performed the  warmup and cooldown standing without limitations. He is motivated to exercise as he walks with his wife before starting rehab. He has a positive attitude. Completed home exercise plan. Javier Phillips currently walks outside with his wife 4-5 non-rehab days/wk for 45 min/day. He also performs the resistance and flexibility training at home inconsistently. I discussed trying to add in 1 non-rehab day of resistance training. Javier Phillips also does balance training at home. I discussed safe exercise with Javier Phillips and trying to not overload himself. He voiced understanding of what I said. I am confident in Javier Phillips carrying out an exercise regimen at home.               Exercise Goals and Review:   Exercise Goals     Row Name 04/29/21 1033 05/13/21 0946 06/12/21 0742         Exercise Goals   Increase Physical Activity Yes Yes Yes     Intervention Provide advice, education, support and counseling about physical activity/exercise needs.;Develop an individualized exercise prescription for aerobic and resistive training based on initial evaluation findings, risk stratification, comorbidities and participant's personal goals. Provide advice, education, support and counseling about physical activity/exercise needs.;Develop an individualized exercise prescription for aerobic and resistive training based on initial evaluation findings, risk stratification, comorbidities and participant's personal goals. Provide advice, education, support and counseling about physical activity/exercise needs.;Develop an individualized exercise prescription for aerobic and resistive training based on initial evaluation findings, risk stratification, comorbidities and participant's personal goals.     Expected Outcomes Short Term: Attend rehab on a regular basis to increase amount of physical activity.;Long Term: Add in home exercise to make exercise part of routine and to increase amount of physical activity.;Long Term: Exercising regularly at least  3-5 days a week. Short Term: Attend rehab on a regular basis to increase amount of physical activity.;Long Term: Add in home exercise to make exercise part of routine and to increase  amount of physical activity.;Long Term: Exercising regularly at least 3-5 days a week. Short Term: Attend rehab on a regular basis to increase amount of physical activity.;Long Term: Add in home exercise to make exercise part of routine and to increase amount of physical activity.;Long Term: Exercising regularly at least 3-5 days a week.     Increase Strength and Stamina Yes Yes Yes     Intervention Provide advice, education, support and counseling about physical activity/exercise needs.;Develop an individualized exercise prescription for aerobic and resistive training based on initial evaluation findings, risk stratification, comorbidities and participant's personal goals. Provide advice, education, support and counseling about physical activity/exercise needs.;Develop an individualized exercise prescription for aerobic and resistive training based on initial evaluation findings, risk stratification, comorbidities and participant's personal goals. Provide advice, education, support and counseling about physical activity/exercise needs.;Develop an individualized exercise prescription for aerobic and resistive training based on initial evaluation findings, risk stratification, comorbidities and participant's personal goals.     Expected Outcomes Short Term: Increase workloads from initial exercise prescription for resistance, speed, and METs.;Short Term: Perform resistance training exercises routinely during rehab and add in resistance training at home;Long Term: Improve cardiorespiratory fitness, muscular endurance and strength as measured by increased METs and functional capacity (6MWT) Short Term: Increase workloads from initial exercise prescription for resistance, speed, and METs.;Short Term: Perform resistance training exercises  routinely during rehab and add in resistance training at home;Long Term: Improve cardiorespiratory fitness, muscular endurance and strength as measured by increased METs and functional capacity (6MWT) Short Term: Increase workloads from initial exercise prescription for resistance, speed, and METs.;Short Term: Perform resistance training exercises routinely during rehab and add in resistance training at home;Long Term: Improve cardiorespiratory fitness, muscular endurance and strength as measured by increased METs and functional capacity (6MWT)     Able to understand and use rate of perceived exertion (RPE) scale Yes Yes Yes     Intervention Provide education and explanation on how to use RPE scale Provide education and explanation on how to use RPE scale Provide education and explanation on how to use RPE scale     Expected Outcomes Short Term: Able to use RPE daily in rehab to express subjective intensity level;Long Term:  Able to use RPE to guide intensity level when exercising independently Short Term: Able to use RPE daily in rehab to express subjective intensity level;Long Term:  Able to use RPE to guide intensity level when exercising independently Short Term: Able to use RPE daily in rehab to express subjective intensity level;Long Term:  Able to use RPE to guide intensity level when exercising independently     Able to understand and use Dyspnea scale Yes Yes Yes     Intervention Provide education and explanation on how to use Dyspnea scale Provide education and explanation on how to use Dyspnea scale Provide education and explanation on how to use Dyspnea scale     Expected Outcomes Short Term: Able to use Dyspnea scale daily in rehab to express subjective sense of shortness of breath during exertion;Long Term: Able to use Dyspnea scale to guide intensity level when exercising independently Short Term: Able to use Dyspnea scale daily in rehab to express subjective sense of shortness of breath during  exertion;Long Term: Able to use Dyspnea scale to guide intensity level when exercising independently Short Term: Able to use Dyspnea scale daily in rehab to express subjective sense of shortness of breath during exertion;Long Term: Able to use Dyspnea scale to guide intensity level when exercising independently  Knowledge and understanding of Target Heart Rate Range (THRR) Yes Yes Yes     Intervention Provide education and explanation of THRR including how the numbers were predicted and where they are located for reference Provide education and explanation of THRR including how the numbers were predicted and where they are located for reference Provide education and explanation of THRR including how the numbers were predicted and where they are located for reference     Expected Outcomes Short Term: Able to state/look up THRR;Short Term: Able to use daily as guideline for intensity in rehab;Long Term: Able to use THRR to govern intensity when exercising independently Short Term: Able to state/look up THRR;Short Term: Able to use daily as guideline for intensity in rehab;Long Term: Able to use THRR to govern intensity when exercising independently Short Term: Able to state/look up THRR;Short Term: Able to use daily as guideline for intensity in rehab;Long Term: Able to use THRR to govern intensity when exercising independently     Expected Outcomes -- -- --     Understanding of Exercise Prescription Yes Yes Yes     Intervention Provide education, explanation, and written materials on patient's individual exercise prescription Provide education, explanation, and written materials on patient's individual exercise prescription Provide education, explanation, and written materials on patient's individual exercise prescription     Expected Outcomes Short Term: Able to explain program exercise prescription;Long Term: Able to explain home exercise prescription to exercise independently Short Term: Able to explain  program exercise prescription;Long Term: Able to explain home exercise prescription to exercise independently Short Term: Able to explain program exercise prescription;Long Term: Able to explain home exercise prescription to exercise independently              Exercise Goals Re-Evaluation :  Exercise Goals Re-Evaluation     Row Name 05/13/21 0946 06/12/21 0742           Exercise Goal Re-Evaluation   Exercise Goals Review Increase Physical Activity;Increase Strength and Stamina;Able to understand and use rate of perceived exertion (RPE) scale;Able to understand and use Dyspnea scale;Knowledge and understanding of Target Heart Rate Range (THRR);Understanding of Exercise Prescription Increase Physical Activity;Increase Strength and Stamina;Able to understand and use rate of perceived exertion (RPE) scale;Able to understand and use Dyspnea scale;Knowledge and understanding of Target Heart Rate Range (THRR);Understanding of Exercise Prescription      Comments Javier Phillips is scheduled to begin exercise this week. Will monitor and progress as able. Javier Phillips has completed 9 exercise sessions. He exercise for 15 min on the treadmill and recumbent bike. He averages 3.87 METs at 2.3 mph @ 3.5% on the treadmill and 4.9 METs at level 3.7 on the recumbent bike. He has progressed for both exercise modes and tolerates progressions well. Javier Phillips is very motivated to exercise as he walks with his wife on non-rehab days. He has mentioned to me that his wife has fast walking pace, and het tried to keep up. He performs the warmup and cooldown standing without limitations. Will continue to monitor and progress as able.      Expected Outcomes Through exercise at rehab and home, the patient will decrease shortness of breath with daily activities and feel confident in carrying out an exercise regimen at home. Through exercise at rehab and home, the patient will decrease shortness of breath with daily activities and feel confident in  carrying out an exercise regimen at home.               Discharge Exercise Prescription (  Final Exercise Prescription Changes):  Exercise Prescription Changes - 06/18/21 1100       Home Exercise Plan   Plans to continue exercise at Home (comment)   walks outside   Frequency Add 1 additional day to program exercise sessions.   Meets minimum requirement   Initial Home Exercises Provided 06/18/21             Nutrition:  Target Goals: Understanding of nutrition guidelines, daily intake of sodium <1538m, cholesterol <2052m calories 30% from fat and 7% or less from saturated fats, daily to have 5 or more servings of fruits and vegetables.  Biometrics:    Nutrition Therapy Plan and Nutrition Goals:   Nutrition Assessments:  MEDIFICTS Score Key: ?70 Need to make dietary changes  40-70 Heart Healthy Diet ? 40 Therapeutic Level Cholesterol Diet   Picture Your Plate Scores: <4<38nhealthy dietary pattern with much room for improvement. 41-50 Dietary pattern unlikely to meet recommendations for good health and room for improvement. 51-60 More healthful dietary pattern, with some room for improvement.  >60 Healthy dietary pattern, although there may be some specific behaviors that could be improved.    Nutrition Goals Re-Evaluation:   Nutrition Goals Discharge (Final Nutrition Goals Re-Evaluation):   Psychosocial: Target Goals: Acknowledge presence or absence of significant depression and/or stress, maximize coping skills, provide positive support system. Participant is able to verbalize types and ability to use techniques and skills needed for reducing stress and depression.  Initial Review & Psychosocial Screening:  Initial Psych Review & Screening - 04/29/21 0944       Initial Review   Current issues with None Identified      Family Dynamics   Good Support System? Yes   wife   Comments No concerns identified at this time      Barriers   Psychosocial barriers  to participate in program There are no identifiable barriers or psychosocial needs.      Screening Interventions   Interventions Encouraged to exercise             Quality of Life Scores:  Scores of 19 and below usually indicate a poorer quality of life in these areas.  A difference of  2-3 points is a clinically meaningful difference.  A difference of 2-3 points in the total score of the Quality of Life Index has been associated with significant improvement in overall quality of life, self-image, physical symptoms, and general health in studies assessing change in quality of life.  PHQ-9: Recent Review Flowsheet Data     [facility] 04/29/2021 04/29/2021   Decreased Interest 0 0   Down, Depressed, Hopeless 0 0   PHQ - 2 Score 0 0   Altered sleeping 0 -   Tired, decreased energy 0 -   Change in appetite 0 -   Feeling bad or failure about yourself  0 -   Trouble concentrating 0 -   Moving slowly or fidgety/restless 0 -   Suicidal thoughts 0 -   PHQ-9 Score 0 -   Difficult doing work/chores Not difficult at all -      Interpretation of Total Score  Total Score Depression Severity:  1-4 = Minimal depression, 5-9 = Mild depression, 10-14 = Moderate depression, 15-19 = Moderately severe depression, 20-27 = Severe depression   Psychosocial Evaluation and Intervention:  Psychosocial Evaluation - 04/29/21 0947       Psychosocial Evaluation & Interventions   Interventions Encouraged to exercise with the program and follow  exercise prescription    Comments No concerns identified    Expected Outcomes To continue to be free of psychosocial concerns while participating in pulmonary rehab.    Continue Psychosocial Services  No Follow up required             Psychosocial Re-Evaluation:  Psychosocial Re-Evaluation     [place] Name 05/14/21 1201 06/19/21 1026 06/19/21 1035         Psychosocial Re-Evaluation   Current issues with None Identified None Identified  None Identified     Comments Javier Phillips has attended only one session and can not evaluate psychosocial barries at this time. Javier Phillips is doing well in the PR program. He seems to have made social connections with his class mates and is enjoying the program. He mentioned to me that his PCP wanted him to gain 10 pounds. I gave him tips for the pulmonary patient for gaining weight and nutrition for the pulmonary patient. He states that it has helped. He is seeking out high protein foods and using liquid supplements.He is maintaining his current wieght. He cintinuines with no psychosocial conerns or barriers with the attending the PR porgram. Javier Phillips continues with no psychosocial barriers or concerns. He is performing well in the PR program. He is exercisng on the treadmill and recumbent bike with increasing workloads and METS levels. He is making social connections with the other participates in the program and seems to be enjoying it.     Expected Outcomes Will continue to monitor as Javier Phillips progresses in the PR program. -- For Yann to continue to be able to participate in the PR program with no psychosocial conern or barriers. That he will continue with his exericse progression.     Interventions Stress management education;Encouraged to attend Cardiac Rehabilitation for the exercise;Encouraged to attend Pulmonary Rehabilitation for the exercise -- Encouraged to attend Pulmonary Rehabilitation for the exercise     Continue Psychosocial Services  No Follow up required -- Follow up required by staff              Psychosocial Discharge (Final Psychosocial Re-Evaluation):  Psychosocial Re-Evaluation - 06/19/21 1035       Psychosocial Re-Evaluation   Current issues with None Identified    Comments Javier Phillips continues with no psychosocial barriers or concerns. He is performing well in the PR program. He is exercisng on the treadmill and recumbent bike with increasing workloads and METS levels. He is making social  connections with the other participates in the program and seems to be enjoying it.    Expected Outcomes For Javier Phillips to continue to be able to participate in the PR program with no psychosocial conern or barriers. That he will continue with his exericse progression.    Interventions Encouraged to attend Pulmonary Rehabilitation for the exercise    Continue Psychosocial Services  Follow up required by staff             Education: Education Goals: Education classes will be provided on a weekly basis, covering required topics. Participant will state understanding/return demonstration of topics presented.  Learning Barriers/Preferences:  Learning Barriers/Preferences - 04/29/21 1002       Learning Barriers/Preferences   Learning Barriers None    Learning Preferences Computer/Internet;Group Instruction;Individual Instruction;Pictoral;Skilled Demonstration;Verbal Instruction;Video;Written Material             Education Topics: Risk Factor Reduction:  -Group instruction that is supported by a PowerPoint presentation. Instructor discusses the definition of a risk factor, different risk factors for pulmonary disease,  and how the heart and lungs work together.   Flowsheet Row PULMONARY REHAB OTHER RESPIRATORY from 06/13/2021 in [place]  Date 06/11/21  Educator Deanne Coffer your numbers handout]       Nutrition for Pulmonary Patient:  -Group instruction provided by PowerPoint slides, verbal discussion, and written materials to support subject matter. The instructor gives an explanation and review of healthy diet recommendations, which includes a discussion on weight management, recommendations for fruit and vegetable consumption, as well as protein, fluid, caffeine, fiber, sodium, sugar, and alcohol. Tips for eating when patients are short of breath are discussed.   Pursed Lip Breathing:  -Group instruction that is supported by demonstration and  informational handouts. Instructor discusses the benefits of pursed lip and diaphragmatic breathing and detailed demonstration on how to preform both.     Oxygen Safety:  -Group instruction provided by PowerPoint, verbal discussion, and written material to support subject matter. There is an overview of What is Oxygen and Why do we need it.  Instructor also reviews how to create a safe environment for oxygen use, the importance of using oxygen as prescribed, and the risks of noncompliance. There is a brief discussion on traveling with oxygen and resources the patient may utilize.   Oxygen Equipment:  -Group instruction provided by [facility] Staff utilizing handouts, written materials, and equipment demonstrations.   Signs and Symptoms:  -Group instruction provided by written material and verbal discussion to support subject matter. Warning signs and symptoms of infection, stroke, and heart attack are reviewed and when to call the physician/911 reinforced. Tips for preventing the spread of infection discussed.   Advanced Directives:  -Group instruction provided by verbal instruction and written material to support subject matter. Instructor reviews Advanced Directive laws and proper instruction for filling out document.   Pulmonary Video:  -Group video education that reviews the importance of medication and oxygen compliance, exercise, good nutrition, pulmonary hygiene, and pursed lip and diaphragmatic breathing for the pulmonary patient.   Exercise for the Pulmonary Patient:  -Group instruction that is supported by a PowerPoint presentation. Instructor discusses benefits of exercise, core components of exercise, frequency, duration, and intensity of an exercise routine, importance of utilizing pulse oximetry during exercise, safety while exercising, and options of places to exercise outside of rehab.   Flowsheet Row PULMONARY REHAB OTHER RESPIRATORY from 06/13/2021 in [place]  Date 06/13/21  Educator Donnetta Simpers  [Handout]       Pulmonary Medications:  -Verbally interactive group education provided by instructor with focus on inhaled medications and proper administration.   Anatomy and Physiology of the Respiratory System and Intimacy:  -Group instruction provided by PowerPoint, verbal discussion, and written material to support subject matter. Instructor reviews respiratory cycle and anatomical components of the respiratory system and their functions. Instructor also reviews differences in obstructive and restrictive respiratory diseases with examples of each. Intimacy, Sex, and Sexuality differences are reviewed with a discussion on how relationships can change when diagnosed with pulmonary disease. Common sexual concerns are reviewed.   MD DAY -A group question and answer session with a medical doctor that allows participants to ask questions that relate to their pulmonary disease state.   OTHER EDUCATION -Group or individual verbal, written, or video instructions that support the educational goals of the pulmonary rehab program. [place] from 06/13/2021 in [place]  Date 05/30/21  Educator Serita Sheller up  and [place] h/o]  Instruction Review Code 1- Verbalizes Understanding       Holiday Eating Survival Tips:  -Group instruction provided by PowerPoint slides, verbal discussion, and written materials to support subject matter. The instructor gives patients tips, tricks, and techniques to help them not only survive but enjoy the holidays despite the onslaught of food that accompanies the holidays.   Knowledge Questionnaire Score:  Knowledge Questionnaire Score - 04/29/21 0953       Knowledge Questionnaire Score   Pre Score 16/18             Core Components/Risk Factors/Patient Goals at Admission:  Personal Goals and Risk Factors at  Admission - 04/29/21 0951       Core Components/Risk Factors/Patient Goals on Admission   Improve shortness of breath with ADL's Yes    Intervention Provide education, individualized exercise plan and daily activity instruction to help decrease symptoms of SOB with activities of daily living.    Expected Outcomes Short Term: Improve cardiorespiratory fitness to achieve a reduction of symptoms when performing ADLs;Long Term: Be able to perform more ADLs without symptoms or delay the onset of symptoms    Increase knowledge of respiratory medications and ability to use respiratory devices properly  Yes    Intervention Provide education and demonstration as needed of appropriate use of medications, inhalers, and oxygen therapy.    Expected Outcomes Short Term: Achieves understanding of medications use. Understands that oxygen is a medication prescribed by physician. Demonstrates appropriate use of inhaler and oxygen therapy.;Long Term: Maintain appropriate use of medications, inhalers, and oxygen therapy.             Core Components/Risk Factors/Patient Goals Review:   Goals and Risk Factor Review     Row Name 05/14/21 1215 06/19/21 1042           Core Components/Risk Factors/Patient Goals Review   Personal Goals Review Improve shortness of breath with ADL's;Develop more efficient breathing techniques such as purse lipped breathing and diaphragmatic breathing and practicing self-pacing with activity.;Increase knowledge of respiratory medications and ability to use respiratory devices properly. Weight Management/Obesity;Improve shortness of breath with ADL's;Develop more efficient breathing techniques such as purse lipped breathing and diaphragmatic breathing and practicing self-pacing with activity.;Increase knowledge of respiratory medications and ability to use respiratory devices properly.      Review Javier Phillips has attended only one session, to soon to address core compents at this time. Javier Phillips  mentioned to me that his PCP wanted him to gain 10 pounds. I provided him handouts for nutrition for the pulmonary patient and tips for weight gain. He is seeking high protien foods and using liquid supplements. His weights has remained stable.He is exercisng on room air with his oxygen saturations being 90-97%. He is  reporting mild to mid with difficulty  SOB with exercising.      Expected Outcomes See initial core components See admission goals. For Javier Phillips to continue to be able to exercise at increasing workloads and METS levels with same or less SOB and saturation levels.               Core Components/Risk Factors/Patient Goals at Discharge (Final Review):   Goals and Risk Factor Review - 06/19/21 1042       Core Components/Risk Factors/Patient Goals Review   Personal Goals Review Weight Management/Obesity;Improve shortness of breath with ADL's;Develop more efficient breathing techniques such as purse lipped breathing and diaphragmatic breathing and practicing self-pacing with activity.;Increase knowledge of respiratory medications and ability  to use respiratory devices properly.    Review Javier Phillips mentioned to me that his PCP wanted him to gain 10 pounds. I provided him handouts for nutrition for the pulmonary patient and tips for weight gain. He is seeking high protien foods and using liquid supplements. His weights has remained stable.He is exercisng on room air with his oxygen saturations being 90-97%. He is  reporting mild to mid with difficulty  SOB with exercising.    Expected Outcomes See admission goals. For Javier Phillips to continue to be able to exercise at increasing workloads and METS levels with same or less SOB and saturation levels.             ITP Comments:   Comments: ITP REVIEW Pt is making expected progress toward Pulmonary Rehab goals after completing 11 sessions. Recommend continued exercise, life style modification, education, and utilization of breathing techniques to  increase stamina and strength, while also decreasing shortness of breath with exertion.   Dr. Rodman Pickle is Medical Director for Pulmonary Rehab at [facility].

## 2021-06-20 ENCOUNTER — Encounter (HOSPITAL_COMMUNITY)
Admission: RE | Admit: 2021-06-20 | Discharge: 2021-06-20 | Disposition: A | Discharge status: Home / Self Care | Payer: Medicare PPO | Source: Ambulatory Visit | Attending: Pulmonary Disease | Admitting: Pulmonary Disease

## 2021-06-20 ENCOUNTER — Other Ambulatory Visit: Payer: Self-pay

## 2021-06-20 DIAGNOSIS — J849 Interstitial pulmonary disease, unspecified: Secondary | ICD-10-CM

## 2021-06-20 NOTE — Progress Notes (Signed)
Daily Session Note  Patient Details  Name: GILLES TRIMPE MRN: [id number] Date of Birth: [birth year] Referring Provider:   April Manson Pulmonary Rehab Walk Test from 04/29/2021 in [place]  Referring Provider [place]       Encounter Date: 06/20/2021  Check In:  Session Check In - 06/20/21 1127       Check-In   Supervising physician immediately available to respond to emergencies [facility] immediately available    Physician(s) Dr. Florencia Reasons    Location [facility]    Staff Present Elmon Else, MS, ACSM-CEP, Exercise Physiologist;David Lilyan Punt, MS, ACSM-CEP, CCRP, Exercise Physiologist;Hinley Brimage Ysidro Evert, RN    Virtual Visit No    Medication changes reported     No    Fall or balance concerns reported    No    Tobacco Cessation No Change    Warm-up and Cool-down Performed as group-led instruction    Resistance Training Performed Yes    VAD Patient? No    PAD/SET Patient? No      Pain Assessment   Currently in Pain? No/denies    Multiple Pain Sites No             Capillary Blood Glucose: No results found for this or any previous visit (from the past 24 hour(s)).    Social History   Tobacco Use  Smoking Status Never  Smokeless Tobacco Never    Goals Met:  Exercise tolerated well No report of concerns or symptoms today Strength training completed today  Goals Unmet:  Not Applicable  Comments: Service time is from 1015 to [place]    Dr. Rodman Pickle is Medical Director for Pulmonary Rehab at [facility].

## 2021-06-25 ENCOUNTER — Encounter (HOSPITAL_COMMUNITY)
Admission: RE | Admit: 2021-06-25 | Discharge: 2021-06-25 | Disposition: A | Discharge status: Home / Self Care | Payer: Medicare PPO | Source: Ambulatory Visit | Attending: Pulmonary Disease | Admitting: Pulmonary Disease

## 2021-06-25 ENCOUNTER — Other Ambulatory Visit: Payer: Self-pay

## 2021-06-25 VITALS — Wt 133.6 lb

## 2021-06-25 DIAGNOSIS — J849 Interstitial pulmonary disease, unspecified: Secondary | ICD-10-CM

## 2021-06-25 NOTE — Progress Notes (Signed)
Daily Session Note  Patient Details  Name: Javier Phillips MRN: [id number] Date of Birth: 1946-04-08 Referring Provider:   April Manson Pulmonary Rehab Walk Test from 04/29/2021 in [place]  Referring Provider [place]       Encounter Date: 06/25/2021  Check In:  Session Check In - 06/25/21 1114       Check-In   Supervising physician immediately available to respond to emergencies [facility] immediately available    Physician(s) Dr. Cyndia Skeeters    Location [facility]    Staff Present Rosebud Poles, RN, Quentin Ore, MS, ACSM-CEP, Exercise Physiologist;Devora Tortorella Ysidro Evert, RN    Virtual Visit No    Medication changes reported     No    Fall or balance concerns reported    No    Tobacco Cessation No Change    Warm-up and Cool-down Performed as group-led instruction    Resistance Training Performed Yes    VAD Patient? No    PAD/SET Patient? No      Pain Assessment   Currently in Pain? No/denies    Multiple Pain Sites No             Capillary Blood Glucose: No results found for this or any previous visit (from the past 24 hour(s)).   Exercise Prescription Changes - 06/25/21 1200       Response to Exercise   Blood Pressure (Admit) 108/64    Blood Pressure (Exercise) 118/60    Blood Pressure (Exit) 106/64    Heart Rate (Admit) 74 bpm    Heart Rate (Exercise) 106 bpm    Heart Rate (Exit) 85 bpm    Oxygen Saturation (Admit) 100 %    Oxygen Saturation (Exercise) 92 %    Oxygen Saturation (Exit) 96 %    Rating of Perceived Exertion (Exercise) 12    Perceived Dyspnea (Exercise) 2    Duration Continue with 30 min of aerobic exercise without signs/symptoms of physical distress.    Intensity THRR unchanged      Progression   Progression Continue to progress workloads to maintain intensity without signs/symptoms of physical distress.      Resistance Training   Training Prescription Yes    Weight Blue bands    Reps  10-15    Time 10 Minutes      Treadmill   MPH 2.3    Grade 3.5    Minutes 15      Recumbant Bike   Level 3.8    Minutes 15    METs 5.2             Social History   Tobacco Use  Smoking Status Never  Smokeless Tobacco Never    Goals Met:  Exercise tolerated well No report of concerns or symptoms today Strength training completed today  Goals Unmet:  Not Applicable  Comments: Service time is from 1020 to [place]    Dr. Rodman Pickle is Medical Director for Pulmonary Rehab at [facility].

## 2021-06-27 ENCOUNTER — Other Ambulatory Visit: Payer: Self-pay

## 2021-06-27 ENCOUNTER — Encounter (HOSPITAL_COMMUNITY)
Admission: RE | Admit: 2021-06-27 | Discharge: 2021-06-27 | Disposition: A | Discharge status: Home / Self Care | Payer: Medicare PPO | Source: Ambulatory Visit | Attending: Pulmonary Disease | Admitting: Pulmonary Disease

## 2021-06-27 DIAGNOSIS — J849 Interstitial pulmonary disease, unspecified: Secondary | ICD-10-CM

## 2021-06-27 NOTE — Progress Notes (Signed)
Daily Session Note  Patient Details  Name: Javier Phillips MRN: [id number] Date of Birth: Sep 11, 1945 Referring Provider:   April Manson Pulmonary Rehab Walk Test from 04/29/2021 in [place]  Referring Provider [place]       Encounter Date: 06/27/2021  Check In:  Session Check In - 06/27/21 1127       Check-In   Supervising physician immediately available to respond to emergencies [facility] immediately available    Physician(s) Dr. Starla Link    Location [facility]    Staff Present Rodney Langton, Cathleen Fears, MS, ACSM-CEP, Exercise Physiologist    Virtual Visit No    Medication changes reported     No    Fall or balance concerns reported    No    Tobacco Cessation No Change    Warm-up and Cool-down Performed as group-led instruction    Resistance Training Performed Yes    VAD Patient? No    PAD/SET Patient? No      Pain Assessment   Currently in Pain? No/denies    Multiple Pain Sites No             Capillary Blood Glucose: No results found for this or any previous visit (from the past 24 hour(s)).    Social History   Tobacco Use  Smoking Status Never  Smokeless Tobacco Never    Goals Met:  Proper associated with RPD/PD & O2 Sat Independence with exercise equipment Exercise tolerated well No report of concerns or symptoms today Strength training completed today  Goals Unmet:  Not Applicable  Comments: Service time is from 1029 to 1135.    Dr. Rodman Pickle is Medical Director for Pulmonary Rehab at [facility].

## 2021-07-02 ENCOUNTER — Other Ambulatory Visit: Payer: Self-pay

## 2021-07-02 ENCOUNTER — Encounter (HOSPITAL_COMMUNITY)
Admission: RE | Admit: 2021-07-02 | Discharge: 2021-07-02 | Disposition: A | Discharge status: Home / Self Care | Payer: Medicare PPO | Source: Ambulatory Visit | Attending: Pulmonary Disease | Admitting: Pulmonary Disease

## 2021-07-02 DIAGNOSIS — J849 Interstitial pulmonary disease, unspecified: Secondary | ICD-10-CM | POA: Diagnosis not present

## 2021-07-02 NOTE — Progress Notes (Signed)
Daily Session Note  Patient Details  Name: Javier Phillips MRN: [id number] Date of Birth: March 24, 1946 Referring Provider:   April Manson Pulmonary Rehab Walk Test from 04/29/2021 in [place]  Referring Provider [place]       Encounter Date: 07/02/2021  Check In:   Capillary Blood Glucose: No results found for this or any previous visit (from the past 24 hour(s)).    Social History   Tobacco Use  Smoking Status Never  Smokeless Tobacco Never    Goals Met:  Exercise tolerated well No report of concerns or symptoms today Strength training completed today  Goals Unmet:  Not Applicable  Comments: Service time is from 1015 to [place]    Dr. Rodman Pickle is Medical Director for Pulmonary Rehab at [facility].

## 2021-07-04 ENCOUNTER — Encounter (HOSPITAL_COMMUNITY)
Admission: RE | Admit: 2021-07-04 | Discharge: 2021-07-04 | Disposition: A | Discharge status: Home / Self Care | Payer: Medicare PPO | Source: Ambulatory Visit | Attending: Pulmonary Disease | Admitting: Pulmonary Disease

## 2021-07-04 ENCOUNTER — Other Ambulatory Visit: Payer: Self-pay

## 2021-07-04 DIAGNOSIS — J849 Interstitial pulmonary disease, unspecified: Secondary | ICD-10-CM | POA: Diagnosis not present

## 2021-07-04 NOTE — Progress Notes (Signed)
Daily Session Note  Patient Details  Name: Javier Phillips MRN: [id number] Date of Birth: [birth year] Referring Provider:   April Manson Pulmonary Rehab Walk Test from 04/29/2021 in [place]  Referring Provider [place]       Encounter Date: 07/04/2021  Check In:  Session Check In - 07/04/21 1153       Check-In   Supervising physician immediately available to respond to emergencies [facility] immediately available    Physician(s) Dr. Kurtis Bushman    Location [facility]    Staff Present Rodney Langton, RN;Carlette Wilber Oliphant, RN, BSN;Ramon Dredge, RN, MHA    Virtual Visit No    Medication changes reported     No    Fall or balance concerns reported    No    Tobacco Cessation No Change    Warm-up and Cool-down Performed as group-led instruction    Resistance Training Performed Yes    VAD Patient? No    PAD/SET Patient? No      Pain Assessment   Currently in Pain? No/denies    Multiple Pain Sites No             Capillary Blood Glucose: No results found for this or any previous visit (from the past 24 hour(s)).    Social History   Tobacco Use  Smoking Status Never  Smokeless Tobacco Never    Goals Met:  Exercise tolerated well No report of concerns or symptoms today Strength training completed today  Goals Unmet:  Not Applicable  Comments: Service time is from 1022 to [place]    Dr. Rodman Pickle is Medical Director for Pulmonary Rehab at [facility].

## 2021-07-09 ENCOUNTER — Other Ambulatory Visit: Payer: Self-pay

## 2021-07-09 ENCOUNTER — Encounter (HOSPITAL_COMMUNITY)
Admission: RE | Admit: 2021-07-09 | Discharge: 2021-07-09 | Disposition: A | Discharge status: Home / Self Care | Payer: Medicare PPO | Source: Ambulatory Visit | Attending: Pulmonary Disease | Admitting: Pulmonary Disease

## 2021-07-09 VITALS — Wt 134.3 lb

## 2021-07-09 DIAGNOSIS — J849 Interstitial pulmonary disease, unspecified: Secondary | ICD-10-CM

## 2021-07-09 NOTE — Progress Notes (Signed)
Daily Session Note ° °Patient Details  °Name: Javier Phillips °MRN: [id number] °Date of Birth: 12/18/1945 °Referring Provider:   °Flowsheet Row Pulmonary Rehab Walk Test from 04/29/2021 in [facility]  °Referring Provider Mannam  ° °  ° ° °Encounter Date: 07/09/2021 ° °Check In: ° Session Check In - 07/09/21 1209   ° °  ° Check-In  ° Supervising physician immediately available to respond to emergencies [facility] immediately available   ° Physician(s) Dr. Amery   ° Location [facility]   ° Staff Present Kaylee Davis, MS, ACSM-CEP, Exercise Physiologist;Lisa Hughes, RN;Carlette Carlton, RN, BSN;Olinty Richards, MS, ACSM CEP, Exercise Physiologist   ° Virtual Visit No   ° Medication changes reported     No   ° Fall or balance concerns reported    No   ° Tobacco Cessation No Change   ° Warm-up and Cool-down Performed as group-led instruction   ° Resistance Training Performed Yes   ° VAD Patient? No   ° PAD/SET Patient? No   °  ° Pain Assessment  ° Currently in Pain? No/denies   ° Multiple Pain Sites No   ° °  °  ° °  ° ° °Capillary Blood Glucose: °No results found for this or any previous visit (from the past 24 hour(s)). ° ° Exercise Prescription Changes - 07/09/21 1400   ° °  ° Response to Exercise  ° Blood Pressure (Admit) 110/60   ° Blood Pressure (Exercise) 122/60   ° Blood Pressure (Exit) 138/52   ° Heart Rate (Admit) 77 bpm   ° Heart Rate (Exercise) 109 bpm   ° Heart Rate (Exit) 80 bpm   ° Oxygen Saturation (Admit) 98 %   ° Oxygen Saturation (Exercise) 91 %   ° Oxygen Saturation (Exit) 97 %   ° Rating of Perceived Exertion (Exercise) 12   ° Perceived Dyspnea (Exercise) 2   ° Duration Continue with 30 min of aerobic exercise without signs/symptoms of physical distress.   ° Intensity THRR unchanged   °  ° Progression  ° Progression Continue to progress workloads to maintain intensity without signs/symptoms of physical distress.   °  ° Resistance Training  °  Training Prescription Yes   ° Weight Blue bands   ° Reps 10-15   ° Time 10 Minutes   °  ° Treadmill  ° MPH 2.2   ° Grade 3.5   ° Minutes 15   °  ° Recumbant Bike  ° Level 3.7   ° Minutes 15   ° METs 5.7   ° °  °  ° °  ° ° °Social History  ° °Tobacco Use  °Smoking Status Never  °Smokeless Tobacco Never  ° ° °Goals Met:  °Exercise tolerated well °No report of concerns or symptoms today °Strength training completed today ° °Goals Unmet:  °Not Applicable ° °Comments: Service time is from 1026 to 1147 ° ° ° °Dr. Jane Ellison is Medical Director for Pulmonary Rehab at [facility].  °

## 2021-07-11 ENCOUNTER — Ambulatory Visit (INDEPENDENT_AMBULATORY_CARE_PROVIDER_SITE_OTHER)
Admission: RE | Admit: 2021-07-11 | Discharge: 2021-07-11 | Disposition: A | Discharge status: Home / Self Care | Payer: Medicare PPO | Source: Ambulatory Visit | Attending: Pulmonary Disease | Admitting: Pulmonary Disease

## 2021-07-11 ENCOUNTER — Encounter (HOSPITAL_COMMUNITY)
Admission: RE | Admit: 2021-07-11 | Discharge: 2021-07-11 | Disposition: A | Discharge status: Home / Self Care | Payer: Medicare PPO | Source: Ambulatory Visit | Attending: Pulmonary Disease | Admitting: Pulmonary Disease

## 2021-07-11 ENCOUNTER — Other Ambulatory Visit: Payer: Self-pay

## 2021-07-11 DIAGNOSIS — I700 Atherosclerosis of aorta: Secondary | ICD-10-CM | POA: Diagnosis not present

## 2021-07-11 DIAGNOSIS — R918 Other nonspecific abnormal finding of lung field: Secondary | ICD-10-CM | POA: Diagnosis not present

## 2021-07-11 DIAGNOSIS — J479 Bronchiectasis, uncomplicated: Secondary | ICD-10-CM | POA: Diagnosis not present

## 2021-07-11 DIAGNOSIS — I2510 Atherosclerotic heart disease of native coronary artery without angina pectoris: Secondary | ICD-10-CM | POA: Diagnosis not present

## 2021-07-11 DIAGNOSIS — J849 Interstitial pulmonary disease, unspecified: Secondary | ICD-10-CM

## 2021-07-11 NOTE — Progress Notes (Signed)
Discharge Progress Report  Patient Details  Name: Javier Phillips MRN: [id number] Date of Birth: 03-30-1946 Referring Provider:   April Manson Pulmonary Rehab Walk Test from 04/29/2021 in [place]  Referring Provider Mannam        Number of Visits: 81  Reason for Discharge:  Patient has met program and personal goals.  Smoking History:  Social History   Tobacco Use  Smoking Status Never  Smokeless Tobacco Never    Diagnosis:  Interstitial lung disease ([place])  ADL UCSD:  Pulmonary Assessment Scores     Row Name 04/29/21 0942 06/25/21 1622       ADL UCSD   ADL Phase Entry Exit    SOB Score total 24 20      CAT Score   CAT Score 7 7      mMRC Score   mMRC Score 1 --             Initial Exercise Prescription:  Initial Exercise Prescription - 04/29/21 1000       Date of Initial Exercise RX and Referring Provider   Date 04/29/21    Referring Provider Mannam    Expected Discharge Date 07/11/21      Treadmill   MPH 1.8    Grade 1    Minutes 15      Recumbant Bike   Level 2    Watts 22    Minutes 15      Prescription Details   Frequency (times per week) 2    Duration Progress to 30 minutes of continuous aerobic without signs/symptoms of physical distress      Intensity   THRR 40-80% of Max Heartrate 58-116    Ratings of Perceived Exertion 11-13      Progression   Progression Continue to progress workloads to maintain intensity without signs/symptoms of physical distress.      Resistance Training   Training Prescription Yes    Weight Blue bands    Reps 10-15             Discharge Exercise Prescription (Final Exercise Prescription Changes):  Exercise Prescription Changes - 07/09/21 1400       Response to Exercise   Blood Pressure (Admit) 110/60    Blood Pressure (Exercise) 122/60    Blood Pressure (Exit) 138/52    Heart Rate (Admit) 77 bpm    Heart Rate (Exercise) 109 bpm    Heart Rate  (Exit) 80 bpm    Oxygen Saturation (Admit) 98 %    Oxygen Saturation (Exercise) 91 %    Oxygen Saturation (Exit) 97 %    Rating of Perceived Exertion (Exercise) 12    Perceived Dyspnea (Exercise) 2    Duration Continue with 30 min of aerobic exercise without signs/symptoms of physical distress.    Intensity THRR unchanged      Progression   Progression Continue to progress workloads to maintain intensity without signs/symptoms of physical distress.      Resistance Training   Training Prescription Yes    Weight Blue bands    Reps 10-15    Time 10 Minutes      Treadmill   MPH 2.2    Grade 3.5    Minutes 15      Recumbant Bike   Level 3.7    Minutes 15    METs 5.7             Functional Capacity:  6 Minute Walk  [place] Name 04/29/21 1028 07/11/21 1211       6 Minute Walk   Phase Initial Discharge    Distance 1530 feet 2280 feet    Distance % Change -- 49.02 %    Distance Feet Change -- 750 ft    Walk Time 6 minutes 6 minutes    # of Rest Breaks 0 0    MPH 2.9 4.32    METS 3.15 4.9    RPE 11 13    Perceived Dyspnea  1 3    VO2 Peak 11.01 17.14    Symptoms No No    Resting HR 79 bpm 76 bpm    Resting BP 104/56 106/60    Resting Oxygen Saturation  98 % 98 %    Exercise Oxygen Saturation  during 6 min walk 94 % 88 %    Max Ex. HR 99 bpm 130 bpm    Max Ex. BP 110/60 130/60    2 Minute Post BP 102/60 118/62      Interval HR   1 Minute HR 76 119    2 Minute HR 76 125    3 Minute HR 86 127    4 Minute HR 89 126    5 Minute HR 91 127    6 Minute HR 99 130    2 Minute Post HR 69 103  100 after 4 min    Interval Heart Rate? Yes Yes      Interval Oxygen   Interval Oxygen? Yes Yes    Baseline Oxygen Saturation % 98 % 98 %    1 Minute Oxygen Saturation % 97 % 93 %    1 Minute Liters of Oxygen 0 L 0 L    2 Minute Oxygen Saturation % 85 %  Cold finger 90 %    2 Minute Liters of Oxygen 0 L 0 L    3 Minute Oxygen Saturation % 96 % 90 %    3 Minute Liters of  Oxygen 0 L 0 L    4 Minute Oxygen Saturation % 95 % 90 %    4 Minute Liters of Oxygen 0 L 0 L    5 Minute Oxygen Saturation % 96 % 90 %    5 Minute Liters of Oxygen 0 L 0 L    6 Minute Oxygen Saturation % 94 % 88 %    6 Minute Liters of Oxygen 0 L 0 L    2 Minute Post Oxygen Saturation % 99 % 98 %    2 Minute Post Liters of Oxygen 0 L 0 L  100% after 4 min             Psychological, QOL, Others - Outcomes: PHQ 2/9: Depression screen [facility] 2/9 06/20/2021 04/29/2021 04/29/2021  Decreased Interest 0 0 0  Down, Depressed, Hopeless 0 0 0  PHQ - 2 Score 0 0 0  Altered sleeping 0 0 -  Tired, decreased energy 0 0 -  Change in appetite 0 0 -  Feeling bad or failure about yourself  0 0 -  Trouble concentrating 0 0 -  Moving slowly or fidgety/restless 0 0 -  Suicidal thoughts 0 0 -  PHQ-9 Score 0 0 -  Difficult doing work/chores Not difficult at all Not difficult at all -    Quality of Life:   Personal Goals: Goals established at orientation with interventions provided to work toward goal.  Personal Goals and Risk Factors at Admission - 04/29/21 0951  Core Components/Risk Factors/Patient Goals on Admission   Improve shortness of breath with ADL's Yes    Intervention Provide education, individualized exercise plan and daily activity instruction to help decrease symptoms of SOB with activities of daily living.    Expected Outcomes Short Term: Improve cardiorespiratory fitness to achieve a reduction of symptoms when performing ADLs;Long Term: Be able to perform more ADLs without symptoms or delay the onset of symptoms    Increase knowledge of respiratory medications and ability to use respiratory devices properly  Yes    Intervention Provide education and demonstration as needed of appropriate use of medications, inhalers, and oxygen therapy.    Expected Outcomes Short Term: Achieves understanding of medications use. Understands that oxygen is a medication prescribed by physician.  Demonstrates appropriate use of inhaler and oxygen therapy.;Long Term: Maintain appropriate use of medications, inhalers, and oxygen therapy.              Personal Goals Discharge:  Goals and Risk Factor Review     Row Name 05/14/21 1215 06/19/21 1042           Core Components/Risk Factors/Patient Goals Review   Personal Goals Review Improve shortness of breath with ADL's;Develop more efficient breathing techniques such as purse lipped breathing and diaphragmatic breathing and practicing self-pacing with activity.;Increase knowledge of respiratory medications and ability to use respiratory devices properly. Weight Management/Obesity;Improve shortness of breath with ADL's;Develop more efficient breathing techniques such as purse lipped breathing and diaphragmatic breathing and practicing self-pacing with activity.;Increase knowledge of respiratory medications and ability to use respiratory devices properly.      Review Loyce has attended only one session, to soon to address core compents at this time. Eddi mentioned to me that his PCP wanted him to gain 10 pounds. I provided him handouts for nutrition for the pulmonary patient and tips for weight gain. He is seeking high protien foods and using liquid supplements. His weights has remained stable.He is exercisng on room air with his oxygen saturations being 90-97%. He is  reporting mild to mid with difficulty  SOB with exercising.      Expected Outcomes See initial core components See admission goals. For Deniro to continue to be able to exercise at increasing workloads and METS levels with same or less SOB and saturation levels.               Exercise Goals and Review:  Exercise Goals     Row Name 04/29/21 1033 05/13/21 0946 06/12/21 0742 07/08/21 1038       Exercise Goals   Increase Physical Activity Yes Yes Yes Yes    Intervention Provide advice, education, support and counseling about physical activity/exercise needs.;Develop an  individualized exercise prescription for aerobic and resistive training based on initial evaluation findings, risk stratification, comorbidities and participant's personal goals. Provide advice, education, support and counseling about physical activity/exercise needs.;Develop an individualized exercise prescription for aerobic and resistive training based on initial evaluation findings, risk stratification, comorbidities and participant's personal goals. Provide advice, education, support and counseling about physical activity/exercise needs.;Develop an individualized exercise prescription for aerobic and resistive training based on initial evaluation findings, risk stratification, comorbidities and participant's personal goals. Provide advice, education, support and counseling about physical activity/exercise needs.;Develop an individualized exercise prescription for aerobic and resistive training based on initial evaluation findings, risk stratification, comorbidities and participant's personal goals.    Expected Outcomes Short Term: Attend rehab on a regular basis to increase amount of physical activity.;Long Term: Add in home exercise  to make exercise part of routine and to increase amount of physical activity.;Long Term: Exercising regularly at least 3-5 days a week. Short Term: Attend rehab on a regular basis to increase amount of physical activity.;Long Term: Add in home exercise to make exercise part of routine and to increase amount of physical activity.;Long Term: Exercising regularly at least 3-5 days a week. Short Term: Attend rehab on a regular basis to increase amount of physical activity.;Long Term: Add in home exercise to make exercise part of routine and to increase amount of physical activity.;Long Term: Exercising regularly at least 3-5 days a week. Short Term: Attend rehab on a regular basis to increase amount of physical activity.;Long Term: Add in home exercise to make exercise part of routine  and to increase amount of physical activity.;Long Term: Exercising regularly at least 3-5 days a week.    Increase Strength and Stamina Yes Yes Yes Yes    Intervention Provide advice, education, support and counseling about physical activity/exercise needs.;Develop an individualized exercise prescription for aerobic and resistive training based on initial evaluation findings, risk stratification, comorbidities and participant's personal goals. Provide advice, education, support and counseling about physical activity/exercise needs.;Develop an individualized exercise prescription for aerobic and resistive training based on initial evaluation findings, risk stratification, comorbidities and participant's personal goals. Provide advice, education, support and counseling about physical activity/exercise needs.;Develop an individualized exercise prescription for aerobic and resistive training based on initial evaluation findings, risk stratification, comorbidities and participant's personal goals. Provide advice, education, support and counseling about physical activity/exercise needs.;Develop an individualized exercise prescription for aerobic and resistive training based on initial evaluation findings, risk stratification, comorbidities and participant's personal goals.    Expected Outcomes Short Term: Increase workloads from initial exercise prescription for resistance, speed, and METs.;Short Term: Perform resistance training exercises routinely during rehab and add in resistance training at home;Long Term: Improve cardiorespiratory fitness, muscular endurance and strength as measured by increased METs and functional capacity (6MWT) Short Term: Increase workloads from initial exercise prescription for resistance, speed, and METs.;Short Term: Perform resistance training exercises routinely during rehab and add in resistance training at home;Long Term: Improve cardiorespiratory fitness, muscular endurance and  strength as measured by increased METs and functional capacity (6MWT) Short Term: Increase workloads from initial exercise prescription for resistance, speed, and METs.;Short Term: Perform resistance training exercises routinely during rehab and add in resistance training at home;Long Term: Improve cardiorespiratory fitness, muscular endurance and strength as measured by increased METs and functional capacity (6MWT) Short Term: Increase workloads from initial exercise prescription for resistance, speed, and METs.;Short Term: Perform resistance training exercises routinely during rehab and add in resistance training at home;Long Term: Improve cardiorespiratory fitness, muscular endurance and strength as measured by increased METs and functional capacity (6MWT)    Able to understand and use rate of perceived exertion (RPE) scale Yes Yes Yes Yes    Intervention Provide education and explanation on how to use RPE scale Provide education and explanation on how to use RPE scale Provide education and explanation on how to use RPE scale Provide education and explanation on how to use RPE scale    Expected Outcomes Short Term: Able to use RPE daily in rehab to express subjective intensity level;Long Term:  Able to use RPE to guide intensity level when exercising independently Short Term: Able to use RPE daily in rehab to express subjective intensity level;Long Term:  Able to use RPE to guide intensity level when exercising independently Short Term: Able to use RPE daily in  rehab to express subjective intensity level;Long Term:  Able to use RPE to guide intensity level when exercising independently Short Term: Able to use RPE daily in rehab to express subjective intensity level;Long Term:  Able to use RPE to guide intensity level when exercising independently    Able to understand and use Dyspnea scale Yes Yes Yes Yes    Intervention Provide education and explanation on how to use Dyspnea scale Provide education and  explanation on how to use Dyspnea scale Provide education and explanation on how to use Dyspnea scale Provide education and explanation on how to use Dyspnea scale    Expected Outcomes Short Term: Able to use Dyspnea scale daily in rehab to express subjective sense of shortness of breath during exertion;Long Term: Able to use Dyspnea scale to guide intensity level when exercising independently Short Term: Able to use Dyspnea scale daily in rehab to express subjective sense of shortness of breath during exertion;Long Term: Able to use Dyspnea scale to guide intensity level when exercising independently Short Term: Able to use Dyspnea scale daily in rehab to express subjective sense of shortness of breath during exertion;Long Term: Able to use Dyspnea scale to guide intensity level when exercising independently Short Term: Able to use Dyspnea scale daily in rehab to express subjective sense of shortness of breath during exertion;Long Term: Able to use Dyspnea scale to guide intensity level when exercising independently    Knowledge and understanding of Target Heart Rate Range (THRR) Yes Yes Yes Yes    Intervention Provide education and explanation of THRR including how the numbers were predicted and where they are located for reference Provide education and explanation of THRR including how the numbers were predicted and where they are located for reference Provide education and explanation of THRR including how the numbers were predicted and where they are located for reference Provide education and explanation of THRR including how the numbers were predicted and where they are located for reference    Expected Outcomes Short Term: Able to state/look up THRR;Short Term: Able to use daily as guideline for intensity in rehab;Long Term: Able to use THRR to govern intensity when exercising independently Short Term: Able to state/look up THRR;Short Term: Able to use daily as guideline for intensity in rehab;Long Term:  Able to use THRR to govern intensity when exercising independently Short Term: Able to state/look up THRR;Short Term: Able to use daily as guideline for intensity in rehab;Long Term: Able to use THRR to govern intensity when exercising independently Short Term: Able to state/look up THRR;Short Term: Able to use daily as guideline for intensity in rehab;Long Term: Able to use THRR to govern intensity when exercising independently    Expected Outcomes -- -- -- --    Understanding of Exercise Prescription Yes Yes Yes Yes    Intervention Provide education, explanation, and written materials on patient's individual exercise prescription Provide education, explanation, and written materials on patient's individual exercise prescription Provide education, explanation, and written materials on patient's individual exercise prescription Provide education, explanation, and written materials on patient's individual exercise prescription    Expected Outcomes Short Term: Able to explain program exercise prescription;Long Term: Able to explain home exercise prescription to exercise independently Short Term: Able to explain program exercise prescription;Long Term: Able to explain home exercise prescription to exercise independently Short Term: Able to explain program exercise prescription;Long Term: Able to explain home exercise prescription to exercise independently Short Term: Able to explain program exercise prescription;Long Term: Able to explain home  exercise prescription to exercise independently             Exercise Goals Re-Evaluation:  Exercise Goals Re-Evaluation     Row Name 05/13/21 0946 06/12/21 0742 07/08/21 1038         Exercise Goal Re-Evaluation   Exercise Goals Review Increase Physical Activity;Increase Strength and Stamina;Able to understand and use rate of perceived exertion (RPE) scale;Able to understand and use Dyspnea scale;Knowledge and understanding of Target Heart Rate Range  (THRR);Understanding of Exercise Prescription Increase Physical Activity;Increase Strength and Stamina;Able to understand and use rate of perceived exertion (RPE) scale;Able to understand and use Dyspnea scale;Knowledge and understanding of Target Heart Rate Range (THRR);Understanding of Exercise Prescription Increase Physical Activity;Increase Strength and Stamina;Able to understand and use rate of perceived exertion (RPE) scale;Able to understand and use Dyspnea scale;Knowledge and understanding of Target Heart Rate Range (THRR);Understanding of Exercise Prescription     Comments Hero is scheduled to begin exercise this week. Will monitor and progress as able. Caylor has completed 9 exercise sessions. He exercise for 15 min on the treadmill and recumbent bike. He averages 3.87 METs at 2.3 mph @ 3.5% on the treadmill and 4.9 METs at level 3.7 on the recumbent bike. He has progressed for both exercise modes and tolerates progressions well. Ty is very motivated to exercise as he walks with his wife on non-rehab days. He has mentioned to me that his wife has fast walking pace, and het tried to keep up. He performs the warmup and cooldown standing without limitations. Will continue to monitor and progress as able. Trygg has completed 16 exercise sessions. He exercise for 15 min on the treadmill and recumbent bike. He averages 4.64 METs at 3.5 mph @ 2.2% on the treadmill and 5.5 METs at level 3.7 on the recumbent bike. He has progressed on the treadmill and tolerates the progression well. Strother is very motivated to exercise as he walks with his wife on non-rehab days. He mentioned how much enjoys Pulmonary Rehab. He performs the warmup and cooldown standing without limitations. Will continue to monitor and progress as able.     Expected Outcomes Through exercise at rehab and home, the patient will decrease shortness of breath with daily activities and feel confident in carrying out an exercise regimen at home.  Through exercise at rehab and home, the patient will decrease shortness of breath with daily activities and feel confident in carrying out an exercise regimen at home. Through exercise at rehab and home, the patient will decrease shortness of breath with daily activities and feel confident in carrying out an exercise regimen at home.              Nutrition & Weight - Outcomes:   Post Biometrics - 06/20/21 1014        Post  Biometrics   Grip Strength 29 kg             Nutrition:   Nutrition Discharge:   Education Questionnaire Score:  Knowledge Questionnaire Score - 06/25/21 1622       Knowledge Questionnaire Score   Post Score 17/18             Goals reviewed with patient; copy given to patient.

## 2021-07-11 NOTE — Progress Notes (Signed)
Daily Session Note  Patient Details  Name: Javier Phillips MRN: [id number] Date of Birth: 08-12-1945 Referring Provider:   April Manson Pulmonary Rehab Walk Test from 04/29/2021 in [place]  Referring Provider [place]       Encounter Date: 07/11/2021  Check In:  Session Check In - 07/11/21 1207       Check-In   Supervising physician immediately available to respond to emergencies [facility] immediately available    Physician(s) Dr. Avon Gully    Location [facility]    Staff Present Rodney Langton, RN;Jetta Gilford Rile BS, ACSM EP-C, Exercise Physiologist;Jayanna Kroeger Rosana Hoes, MS, ACSM-CEP, Exercise Physiologist    Virtual Visit No    Medication changes reported     No    Fall or balance concerns reported    No    Tobacco Cessation No Change    Warm-up and Cool-down Performed as group-led instruction    Resistance Training Performed Yes    PAD/SET Patient? No      Pain Assessment   Currently in Pain? No/denies    Multiple Pain Sites No             Capillary Blood Glucose: No results found for this or any previous visit (from the past 24 hour(s)).    Social History   Tobacco Use  Smoking Status Never  Smokeless Tobacco Never    Goals Met:  Proper associated with RPD/PD & O2 Sat Exercise tolerated well No report of concerns or symptoms today  Goals Unmet:  Not Applicable  Comments: Service time is from 1010 to 1035. Completed post 6 MWT.   Dr. Rodman Pickle is Medical Director for Pulmonary Rehab at [facility].

## 2021-08-05 ENCOUNTER — Encounter: Payer: Self-pay | Admitting: Internal Medicine

## 2021-08-05 ENCOUNTER — Other Ambulatory Visit: Payer: Self-pay

## 2021-08-05 ENCOUNTER — Ambulatory Visit: Payer: Medicare PPO | Admitting: Internal Medicine

## 2021-08-05 VITALS — BP 120/60 | HR 79 | Temp 98.2°F | Ht 65.5 in | Wt 136.0 lb

## 2021-08-05 DIAGNOSIS — R051 Acute cough: Secondary | ICD-10-CM | POA: Diagnosis not present

## 2021-08-05 DIAGNOSIS — J00 Acute nasopharyngitis [common cold]: Secondary | ICD-10-CM | POA: Diagnosis not present

## 2021-08-05 DIAGNOSIS — J84112 Idiopathic pulmonary fibrosis: Secondary | ICD-10-CM | POA: Diagnosis not present

## 2021-08-05 LAB — POCT INFLUENZA A/B
Influenza A, POC: NEGATIVE
Influenza B, POC: NEGATIVE

## 2021-08-05 LAB — POC COVID19 BINAXNOW: SARS Coronavirus 2 Ag: NEGATIVE

## 2021-08-05 MED ORDER — FLUTICASONE PROPIONATE 50 MCG/ACT NA SUSP: 1.0000 | Freq: Every day | NASAL | 2 refills | Status: DC

## 2021-08-05 NOTE — Patient Instructions (Addendum)
Follow up with Dr. Isaiah Serge as scheduled.   We tested you for covid and the flu today.  Hopefully you'll start feeling better soon.  Use nasal saline rinses as needed to help with nasal congestion. Can use an over the counter cold and flu medicine to help with aches, pains, and congestion.  Saltwater gargles to help with a sore throat.   Flonase - 1 spray on each side of your nose twice a day for first week, then 1 spray on each side.   Instructions for use: If you also use a saline nasal spray or rinse, use that first. Position the head with the chin slightly tucked. Use the right hand to spray into the left nostril and the right hand to spray into the left nostril.   Point the bottle away from the septum of your nose (cartilage that divides the two sides of your nose).  Hold the nostril closed on the opposite side from where you will spray Spray once and gently sniff to pull the medicine into the higher parts of your nose.  Don't sniff too hard as the medicine will drain down the back of your throat instead. Repeat with a second spray on the same side if prescribed. Repeat on the other side of your nose.

## 2021-08-05 NOTE — Addendum Note (Signed)
Addended by: Delrae Rend on: 08/05/2021 03:43 PM   Modules accepted: Orders

## 2021-08-05 NOTE — Progress Notes (Signed)
HADRIEL KOLLER    QD:[id number]    1945-10-14  Primary Care Physician:Ross, Dwyane Luo, MD Date of Appointment: 08/05/2021 Established Patient Visit  Chief complaint:   Chief Complaint  Patient presents with   Acute Visit    Cough, dry.  Nasal congestion.  Some sob.  Symptoms since 2/16.     HPI: Javier Phillips is a 76 y.o. 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 Vaughan Browner.   Interval Updates: Here for a sick visit. Last seen in July 2022 and is overdue for follow up. Has been going to pulmonary rehab. Had a sick telephone call in Nov 2022 treated with prednisone. He is not on anti-fibrotic therapy.   Sinus congest, drainage, cough (mostly nonproductive.) Symptoms started 4 days ago. Taking tylenol and warm compresses.  Mild worsening of shortness of breath.  Wife has been sick.  Home covid test was negative this morning.  Has mild body aches  Appetite is ok and he is keeping down fluids. No nausea.   I have reviewed the patient's family social and past medical history and updated as appropriate.   Past Medical History:  Diagnosis Date   Enlarged prostate    Heart murmur    Diagnosis of MVP years ago   Interstitial cystitis    Interstitial lung disease ([place])     Past Surgical History:  Procedure Laterality Date   HERNIA REPAIR      Family History  Problem Relation Age of Onset   Stroke Mother    Dementia Father    Dementia Brother     Social History   Occupational History   Not on file  Tobacco Use   Smoking status: Never   Smokeless tobacco: Never  Vaping Use   Vaping Use: Never used  Substance and Sexual Activity   Alcohol use: No   Drug use: No   Sexual activity: Yes     Physical Exam: Blood pressure 120/60, pulse 79, temperature 98.2 F (36.8 C), temperature source Oral, height 5' 5.5" (1.664 m), weight 136 lb (61.7 kg), SpO2 96 %.  Gen:      No acute distress ENT:  no nasal polyps, mucus membranes moist Lungs:     No increased respiratory effort, symmetric chest wall excursion, clear to auscultation bilaterally, bibasilar crackles CV:         Regular rate and rhythm; no murmurs, rubs, or gallops.  No pedal edema   Data Reviewed: Imaging: I have personally reviewed the CT chest Jan 2023 shows honeycombing in an apical to basal gradient consistent with UIP.   PFTs:  PFT Results Latest Ref Rng & Units 11/27/2020  FVC-Pre L 2.05  FVC-Predicted Pre % 59  FVC-Post L 2.14  FVC-Predicted Post % 61  Pre FEV1/FVC % % 92  Post FEV1/FCV % % 92  FEV1-Pre L 1.89  FEV1-Predicted Pre % 75  FEV1-Post L 1.97  DLCO uncorrected ml/min/mmHg 19.14  DLCO UNC% % 88  DLCO corrected ml/min/mmHg 19.14  DLCO COR %Predicted % 88  DLVA Predicted % 120  TLC L 3.86  TLC % Predicted % 63  RV % Predicted % 65   I have personally reviewed the patient's PFTs and mild restriction to ventilation.  Labs:  Immunization status: Immunization History  Administered Date(s) Administered   Fluad Quad(high Dose 65+) 04/15/2021   Influenza Split 05/26/2011   Influenza, High Dose Seasonal PF 04/21/2018, 04/15/2019   Influenza,inj,Quad PF,6+ Mos 03/17/2017  Influenza,inj,quad, With Preservative 06/08/2015, 02/13/2016   Influenza-Unspecified 03/26/2012, 05/04/2013, 04/19/2014   PFIZER(Purple Top)SARS-COV-2 Vaccination 07/08/2019, 07/29/2019, 04/12/2020   Pneumococcal Conjugate-13 07/05/2014   Pneumococcal Polysaccharide-23 05/25/2009, 05/26/2011   Tdap 12/30/2011   Zoster, Live 05/23/2020    External Records Personally Reviewed: pcp, pulmonary rehab, pulmonologist.   Assessment:  ILD, likely IPF Acute viral URI  Plan/Recommendations: Restested for flu and covid because he is within five days of symptom onset. He was negative.   Recommended flonase nasal spray and nasal saline rinses to help with sinus congestion.  Ok to use over the counter cold and flu remedies.  No evidence of pna or ipf flare.    Return to  Care: Follow up with Dr Vaughan Browner as scheduled.    Lenice Llamas, MD [facility] [place]     es

## 2021-10-03 ENCOUNTER — Ambulatory Visit: Payer: Medicare PPO | Admitting: Pulmonary Disease

## 2021-10-03 ENCOUNTER — Ambulatory Visit (INDEPENDENT_AMBULATORY_CARE_PROVIDER_SITE_OTHER): Payer: Medicare PPO | Admitting: Pulmonary Disease

## 2021-10-03 ENCOUNTER — Encounter: Payer: Self-pay | Admitting: Pulmonary Disease

## 2021-10-03 VITALS — BP 112/62 | HR 77 | Temp 97.8°F | Ht 65.0 in | Wt 131.0 lb

## 2021-10-03 DIAGNOSIS — R0609 Other forms of dyspnea: Secondary | ICD-10-CM

## 2021-10-03 DIAGNOSIS — J849 Interstitial pulmonary disease, unspecified: Secondary | ICD-10-CM

## 2021-10-03 LAB — PULMONARY FUNCTION TEST
FEF 25-75 Post: 5.94 L/sec
FEF 25-75 Pre: 2.04 L/sec
FEF2575-%Change-Post: 190 %
FEF2575-%Pred-Post: 334 %
FEF2575-%Pred-Pre: 115 %
FEV1-%Change-Post: 34 %
FEV1-%Pred-Post: 80 %
FEV1-%Pred-Pre: 59 %
FEV1-Post: 1.96 L
FEV1-Pre: 1.46 L
FEV1FVC-%Change-Post: 8 %
FEV1FVC-%Pred-Pre: 117 %
FEV6-%Change-Post: 24 %
FEV6-%Pred-Post: 67 %
FEV6-%Pred-Pre: 54 %
FEV6-Post: 2.13 L
FEV6-Pre: 1.72 L
FEV6FVC-%Pred-Post: 107 %
FEV6FVC-%Pred-Pre: 107 %
FVC-%Change-Post: 24 %
FVC-%Pred-Post: 62 %
FVC-%Pred-Pre: 50 %
FVC-Post: 2.13 L
FVC-Pre: 1.72 L
Post FEV1/FVC ratio: 92 %
Post FEV6/FVC ratio: 100 %
Pre FEV1/FVC ratio: 85 %
Pre FEV6/FVC Ratio: 100 %
RV % pred: 57 %
RV: 1.31 L
TLC % pred: 59 %
TLC: 3.61 L

## 2021-10-03 NOTE — Progress Notes (Signed)
? ?      Javier Phillips    [id number]    1946/04/15 ? ?Primary Care Physician:Ross, Darlen Round, MD ? ?Referring Physician: Daisy Floro, MD ?[address] ?[place],  [place] [address] ? ?Chief complaint: Follow-up for interstitial lung disease ? ?HPI: ?76 year old with history of mitral valve prolapse, GERD ?Complains of increasing dyspnea on exertion for the past 4 months.  This is associated with a dry hacking cough.  He is on albuterol and cough drops which help.  Cough is exacerbated by exposure to cigarette smoke, freshly cut grass ? ?Stays active with exercise at [facility], swimming but this has been limited somewhat recently ?Has followed with Dr. Antoine Poche for mitral valve prolapse ? ?Denies any joint pain, rash.  He has dry eyes.  Occasional dysphagia with choking on dry crackers.  ? ?Has seen Dr. Dimple Casey for elevated ANA in 2022.  Findings are not felt to be secondary to connective tissue disease.  Additional serologic tests were ordered which are negative ? ?He has also seen speech pathology in 2022 for modified barium swallow with findings of mild aspiration risk but no actual witnessed aspiration ? ?Pets: Cats ?Occupation: Retired Systems developer.  Worked in [employer] for a couple of years in his early 46s ?Exposures: Has mold in his crawl space.  Wife uses a down pillow.   ?ILD questionnaire 11/27/20-negative except for above ?Smoking history: Never smoker ?Travel history: No significant travel history ?Relevant family history: Has family history of asthma and emphysema ? ?Interim history:  ?Here for review of PFTs.  He recently completed pulmonary rehab without any issue.  No desats noted during exertion. ? ?Outpatient Encounter Medications as of 10/03/2021  ?Medication Sig  ? acetaminophen (TYLENOL) 325 MG tablet Take 650 mg by mouth every 6 (six) hours as needed. For fever  ? Albuterol Sulfate (PROAIR RESPICLICK) 108 (90 Base) MCG/ACT AEPB   ? amitriptyline (ELAVIL) 10 MG tablet 1  tablet at bedtime.  ? cetirizine (ZYRTEC) 10 MG tablet Take 10 mg by mouth daily.  ? famotidine (PEPCID) 20 MG tablet Take 20 mg by mouth daily as needed.  ? fluticasone (FLONASE) 50 MCG/ACT nasal spray Place 1 spray into both nostrils daily.  ? oxybutynin (DITROPAN-XL) 10 MG 24 hr tablet Take 10 mg by mouth daily.  ? Probiotic Product (PROBIOTIC PO) Take by mouth daily.  ? Tamsulosin HCl (FLOMAX) 0.4 MG CAPS Take 0.4 mg by mouth daily.  ? ?No facility-administered encounter medications on file as of 10/03/2021.  ? ?Physical Exam: ?Blood pressure 112/62, pulse 77, temperature 97.8 ?F (36.6 ?C), temperature source Oral, height 5\' 5"  (1.651 m), weight 131 lb (59.4 kg), SpO2 96 %. ?Gen:      No acute distress ?HEENT:  EOMI, sclera anicteric ?Neck:     No masses; no thyromegaly ?Lungs:    Bibasal crackles ?CV:         Regular rate and rhythm; no murmurs ?Abd:      + bowel sounds; soft, non-tender; no palpable masses, no distension ?Ext:    No edema; adequate peripheral perfusion ?Skin:      Warm and dry; no rash ?Neuro: alert and oriented x 3 ?Psych: normal mood and affect  ? ?Data Reviewed: ?Imaging: ?Chest x-ray 09/10/2020- interstitial changes at the lung base suggestive of pulmonary fibrosis.  ? ?High-resolution CT 10/19/2020-basilar predominant fibrotic lung disease and probable UIP pattern ? ?High-resolution CT 07/13/2021-stable pattern of pulmonary fibrosis in probable UIP pattern ?I have reviewed  the images personally ? ?PFTs: ?11/27/2020 ?FVC 2.14 (61%), FEV1 1.97 (79%), F/F 92, TLC 3.86 (63%), DLCO 19.14 (88%] ?Moderate restriction ? ?10/03/2021 ?FVC 2.13 [62%], FEV1 1.96 [80%], F/F 92, TLC 3.61 [59%] ?Severe restriction ? ?Labs: ?Labs from primary care 09/07/2020 ?BNP -23.5 ?Comprehensive metabolic panel-normal with normal liver function ?CBC WBC 8.3, hemoglobin 13.2, platelets 152, 0% eos ? ?CTD serologies 10/05/2020 ?ANA 1:640, nuclear speckled, positive myositis antibody, rheumatoid factor 22 ? 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 ?Rheumatology eval notes no evidence of connective tissue disease though he has positive ANA and rheumatoid factor ?No evidence of significant aspiration ? ?Will not do lung biopsy given age and risks ?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 ? ?We had a detailed discussion in office today with him about antifibrotic therapy, risks and benefits.  Discussed that CT is stable but PFTs do show worsening lung volumes.  He wants to avoid treatment due to gastrointestinal side effects. ? ? ?Plan/Recommendations: ?High-res CT, PFTs in January 2024. ? ?Chilton Greathouse MD ?[facility] ?10/03/2021, 11:18 AM ? ?CC: Daisy Floro, MD ? ? ?

## 2021-10-03 NOTE — Progress Notes (Signed)
PFT done today. 

## 2021-10-03 NOTE — Patient Instructions (Signed)
His CT looks stable the lung function test shows a small reduction in lung capacity ?Per our discussion today we will continue monitoring ?We will order follow-up CT and spirometry, lung volumes for January 2024 and return to clinic after these tests ?

## 2021-12-03 DIAGNOSIS — Z125 Encounter for screening for malignant neoplasm of prostate: Secondary | ICD-10-CM | POA: Diagnosis not present

## 2021-12-03 DIAGNOSIS — K6289 Other specified diseases of anus and rectum: Secondary | ICD-10-CM | POA: Diagnosis not present

## 2022-03-21 DIAGNOSIS — H2513 Age-related nuclear cataract, bilateral: Secondary | ICD-10-CM | POA: Diagnosis not present

## 2022-03-21 DIAGNOSIS — H524 Presbyopia: Secondary | ICD-10-CM | POA: Diagnosis not present

## 2022-06-05 DIAGNOSIS — N401 Enlarged prostate with lower urinary tract symptoms: Secondary | ICD-10-CM | POA: Diagnosis not present

## 2022-06-05 DIAGNOSIS — N3010 Interstitial cystitis (chronic) without hematuria: Secondary | ICD-10-CM | POA: Diagnosis not present

## 2022-06-05 DIAGNOSIS — R351 Nocturia: Secondary | ICD-10-CM | POA: Diagnosis not present

## 2022-06-18 ENCOUNTER — Telehealth: Payer: Self-pay | Admitting: Pulmonary Disease

## 2022-06-18 ENCOUNTER — Ambulatory Visit
Admission: RE | Admit: 2022-06-18 | Discharge: 2022-06-18 | Disposition: A | Discharge status: Home / Self Care | Payer: Medicare PPO | Source: Ambulatory Visit | Attending: Pulmonary Disease | Admitting: Pulmonary Disease

## 2022-06-18 DIAGNOSIS — J849 Interstitial pulmonary disease, unspecified: Secondary | ICD-10-CM

## 2022-06-18 DIAGNOSIS — I700 Atherosclerosis of aorta: Secondary | ICD-10-CM | POA: Diagnosis not present

## 2022-06-18 DIAGNOSIS — J479 Bronchiectasis, uncomplicated: Secondary | ICD-10-CM | POA: Diagnosis not present

## 2022-06-18 DIAGNOSIS — J949 Pleural condition, unspecified: Secondary | ICD-10-CM | POA: Diagnosis not present

## 2022-06-18 DIAGNOSIS — J84112 Idiopathic pulmonary fibrosis: Secondary | ICD-10-CM | POA: Diagnosis not present

## 2022-06-18 NOTE — Telephone Encounter (Signed)
Called and spoke with pt letting him know that we are recommending our patients to get the RSV vaccine and he verbalized understanding. While speaking with pt, he said he just had CT and needed to get f/u scheduled. Looked at last OV and saw that Dr. Vaughan Browner wanted pt to have PFT done to check lung volumes and also to have OV after. Both PFT and OV have been scheduled. Nothing further needed.

## 2022-06-19 ENCOUNTER — Telehealth: Payer: Self-pay | Admitting: Pulmonary Disease

## 2022-06-19 DIAGNOSIS — R918 Other nonspecific abnormal finding of lung field: Secondary | ICD-10-CM

## 2022-06-19 NOTE — Telephone Encounter (Signed)
Received call report from [place] with [facility] on patient's HRCT done on 06/18/22. Dr. Vaughan Browner please review the result/impression copied below:  IMPRESSION: 1. Indistinct solid 1.4 cm nodular focus of consolidation in the peripheral left lung along the major fissure, increased from 0.9 cm on 07/11/2021 CT. Differential includes nodular fibrosis versus primary bronchogenic malignancy. Suggest PET-CT for further characterization at this time. 2. Spectrum of findings compatible with basilar predominant fibrotic interstitial lung disease without frank honeycombing, with mild progression of disease since the baseline 10/19/2020 high-resolution chest CT, with slight progression since 07/11/2021 CT. Findings are categorized as probable UIP per consensus guidelines: Diagnosis of Idiopathic Pulmonary Fibrosis: An Official ATS/ERS/JRS/ALAT Clinical Practice Guideline. [place], Iss 5, [phone], Feb 14 2017. 3. Two-vessel coronary atherosclerosis. 4.  Aortic Atherosclerosis (ICD10-I70.0).  Please advise, thank you.

## 2022-06-19 NOTE — Telephone Encounter (Signed)
I called and discussed with patient. Please order a PET scan

## 2022-06-19 NOTE — Telephone Encounter (Signed)
[facility] calling w/call report for this PT. [facility] # [id number]

## 2022-06-20 NOTE — Telephone Encounter (Signed)
PET scan order placed for first available. Nothing further at this time.

## 2022-07-04 ENCOUNTER — Ambulatory Visit (HOSPITAL_COMMUNITY)
Admission: RE | Admit: 2022-07-04 | Discharge: 2022-07-04 | Disposition: A | Discharge status: Home / Self Care | Payer: Medicare PPO | Source: Ambulatory Visit | Attending: Pulmonary Disease | Admitting: Pulmonary Disease

## 2022-07-04 DIAGNOSIS — R918 Other nonspecific abnormal finding of lung field: Secondary | ICD-10-CM | POA: Diagnosis not present

## 2022-07-04 LAB — GLUCOSE, CAPILLARY: Glucose-Capillary: 95 mg/dL (ref 70–99)

## 2022-07-04 MED ORDER — FLUDEOXYGLUCOSE F - 18 (FDG) INJECTION
6.6000 | Freq: Once | INTRAVENOUS | Status: AC
  Administered 2022-07-04: 6.49 via INTRAVENOUS

## 2022-07-08 ENCOUNTER — Ambulatory Visit (INDEPENDENT_AMBULATORY_CARE_PROVIDER_SITE_OTHER): Payer: Medicare PPO | Admitting: Pulmonary Disease

## 2022-07-08 DIAGNOSIS — J849 Interstitial pulmonary disease, unspecified: Secondary | ICD-10-CM

## 2022-07-08 LAB — PULMONARY FUNCTION TEST
DL/VA % pred: 84 %
DL/VA: 3.43 ml/min/mmHg/L
DLCO cor % pred: 55 %
DLCO cor: 11.92 ml/min/mmHg
DLCO unc % pred: 55 %
DLCO unc: 11.92 ml/min/mmHg
FEF 25-75 Post: 3.43 L/s
FEF 25-75 Pre: 2.42 L/s
FEF2575-%Change-Post: 42 %
FEF2575-%Pred-Post: 199 %
FEF2575-%Pred-Pre: 140 %
FEV1-%Change-Post: 4 %
FEV1-%Pred-Post: 80 %
FEV1-%Pred-Pre: 76 %
FEV1-Post: 1.94 L
FEV1-Pre: 1.85 L
FEV1FVC-%Change-Post: 1 %
FEV1FVC-%Pred-Pre: 123 %
FEV6-%Change-Post: 4 %
FEV6-%Pred-Post: 68 %
FEV6-%Pred-Pre: 65 %
FEV6-Post: 2.16 L
FEV6-Pre: 2.07 L
FEV6FVC-%Change-Post: 0 %
FEV6FVC-%Pred-Post: 107 %
FEV6FVC-%Pred-Pre: 107 %
FVC-%Change-Post: 3 %
FVC-%Pred-Post: 63 %
FVC-%Pred-Pre: 61 %
FVC-Post: 2.16 L
FVC-Pre: 2.08 L
Post FEV1/FVC ratio: 90 %
Post FEV6/FVC ratio: 100 %
Pre FEV1/FVC ratio: 89 %
Pre FEV6/FVC Ratio: 100 %
RV % pred: 61 %
RV: 1.4 L
TLC % pred: 57 %
TLC: 3.5 L

## 2022-07-08 NOTE — Progress Notes (Signed)
PFT done today. 

## 2022-07-09 ENCOUNTER — Encounter: Payer: Self-pay | Admitting: Pulmonary Disease

## 2022-07-09 ENCOUNTER — Ambulatory Visit: Payer: Medicare PPO | Admitting: Pulmonary Disease

## 2022-07-09 VITALS — BP 122/64 | HR 73 | Temp 97.6°F | Ht 64.5 in | Wt 130.0 lb

## 2022-07-09 DIAGNOSIS — J84112 Idiopathic pulmonary fibrosis: Secondary | ICD-10-CM

## 2022-07-09 DIAGNOSIS — R918 Other nonspecific abnormal finding of lung field: Secondary | ICD-10-CM

## 2022-07-09 DIAGNOSIS — J849 Interstitial pulmonary disease, unspecified: Secondary | ICD-10-CM | POA: Diagnosis not present

## 2022-07-09 NOTE — Progress Notes (Addendum)
Javier Phillips    [id number]    Jan 22, 1946  Primary Care Physician:Ross, Dwyane Luo, MD  Referring Physician: Lawerance Cruel, MD [place],  [place] [address]  Chief complaint: Follow-up for interstitial lung disease  HPI: 77 y.o.  with history of mitral valve prolapse, GERD Complains of increasing dyspnea on exertion for the past 4 months.  This is associated with a dry hacking cough.  He is on albuterol and cough drops which help.  Cough is exacerbated by exposure to cigarette smoke, freshly cut grass  Stays active with exercise at [facility], swimming but this has been limited somewhat recently Has followed with Dr. Percival Spanish for mitral valve prolapse  Denies any joint pain, rash.  He has dry eyes.  Occasional dysphagia with choking on dry crackers.   Has seen Dr. Benjamine Mola for elevated ANA in 2022.  Findings are not felt to be secondary to connective tissue disease.  Additional serologic tests were ordered which are negative  He has also seen speech pathology in 2022 for modified barium swallow with findings of mild aspiration risk but no actual witnessed aspiration Completed pulmonary rehab in 2023  Pets: Cats Occupation: Retired Agricultural engineer.  Worked in [employer] for a couple of years in his early 20s Exposures: Has mold in his crawl space.  Wife uses a down pillow.   ILD questionnaire 11/27/20-negative except for above Smoking history: Never smoker Travel history: No significant travel history Relevant family history: Has family history of asthma and emphysema  Interim history:  Here for review of PFTs and recent CT, pet imaging States her breathing is stable  Outpatient Encounter Medications as of 07/09/2022  Medication Sig   acetaminophen (TYLENOL) 325 MG tablet Take 650 mg by mouth every 6 (six) hours as needed. For fever   Albuterol Sulfate (PROAIR RESPICLICK) 623 (90 Base) MCG/ACT AEPB    amitriptyline (ELAVIL) 10 MG tablet 1  tablet at bedtime.   cetirizine (ZYRTEC) 10 MG tablet Take 10 mg by mouth daily.   famotidine (PEPCID) 20 MG tablet Take 20 mg by mouth daily as needed.   oxybutynin (DITROPAN-XL) 10 MG 24 hr tablet Take 10 mg by mouth daily.   Tamsulosin HCl (FLOMAX) 0.4 MG CAPS Take 0.4 mg by mouth daily.   [DISCONTINUED] fluticasone (FLONASE) 50 MCG/ACT nasal spray Place 1 spray into both nostrils daily.   [DISCONTINUED] Probiotic Product (PROBIOTIC PO) Take by mouth daily.   No facility-administered encounter medications on file as of 07/09/2022.   Physical Exam: Blood pressure 122/64, pulse 73, temperature 97.6 F (36.4 C), temperature source Oral, height 5' 4.5" (1.638 m), weight 130 lb (59 kg), SpO2 98 %. Gen:      No acute distress HEENT:  EOMI, sclera anicteric Neck:     No masses; no thyromegaly Lungs:    Clear to auscultation bilaterally; normal respiratory effort CV:         Regular rate and rhythm; no murmurs Abd:      + bowel sounds; soft, non-tender; no palpable masses, no distension Ext:    No edema; adequate peripheral perfusion Skin:      Warm and dry; no rash Neuro: alert and oriented x 3 Psych: normal mood and affect   Data Reviewed: Imaging: Chest x-ray 09/10/2020- interstitial changes at the lung base suggestive of pulmonary fibrosis.   High-resolution CT 10/19/2020-basilar predominant fibrotic lung disease and probable UIP pattern  High-resolution CT 07/13/2021-stable pattern of  pulmonary fibrosis in probable UIP pattern  High resolution CT 06/19/2022-slight progression in pulmonary fibrosis.  Development of solid 1.4 cm nodular focus of consolidation in the left lung.  PET/CT 07/04/2022-low-level uptake in consolidation of left lung Interstitial lung disease  I have reviewed the images personally  PFTs: 11/27/2020 FVC 2.14 (61%), FEV1 1.97 (79%), F/F 92, TLC 3.86 (63%), DLCO 19.14 (88%] Moderate restriction  10/03/2021 FVC 2.13 [62%], FEV1 1.96 [80%], F/F 92, TLC 3.61  [59%] Severe restriction  07/08/2022 FVC 2.16 [63%], FEV1 1.94 [80%], F/F 90, TLC 3.50 [57%], DLCO 11.92 [55%] Severe restriction, diffusion defect  Labs: Labs from primary care 09/07/2020 BNP -23.5 Comprehensive metabolic panel-normal with normal liver function CBC WBC 8.3, hemoglobin 13.2, platelets 152, 0% eos  CTD serologies 10/05/2020 ANA 1:640, nuclear speckled, positive myositis antibody, rheumatoid factor 22  Assessment:  Interstitial lung disease 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  We had a detailed discussion in office today with him about antifibrotic therapy, risks and benefits.  Discussed that CT and PFTs are slightly worse.  He wants to avoid treatment due to gastrointestinal side effects and will continue to think about it  Lung nodule His recent imaging shows progressive increase in nodular peripheral opacity in the left lung with no significant uptake on PET.  This could still be a low-grade adenocarcinoma and he is interested in further workup Will discuss case with colleagues about navigational biopsy.  Plan/Recommendations: Continue discussion about antifibrotic therapy Referral for bronchoscopic navigational biopsy.  Marshell Garfinkel MD [facility] 07/09/2022, 9:04 AM  CC: Lawerance Cruel, MD

## 2022-07-09 NOTE — Patient Instructions (Signed)
Will continue our discussions on possible treatment of pulmonary fibrosis with either Esbriet or Ofev I will also discuss your case with my colleagues for possible lung biopsy  Follow-up in 3 months.

## 2022-07-25 ENCOUNTER — Telehealth: Payer: Self-pay | Admitting: Emergency Medicine

## 2022-07-25 DIAGNOSIS — R911 Solitary pulmonary nodule: Secondary | ICD-10-CM

## 2022-07-25 NOTE — Telephone Encounter (Signed)
Called and left VM. Will work on setting up robotic nav to eval enlarging left nodule.  Will try him again

## 2022-07-28 NOTE — Telephone Encounter (Signed)
Patient called back to speak with Dr. Lamonte Sakai. Best number is [phone]. Patient will be back at home after 330 today.

## 2022-07-28 NOTE — Telephone Encounter (Signed)
Left pt message so we can speak about his CT chest, possible navigational bronch under general anesthesia. Will try to call him back

## 2022-07-29 NOTE — Telephone Encounter (Signed)
Pt f/u about callback ask to call back on   [phone]

## 2022-07-29 NOTE — Telephone Encounter (Signed)
Returned pt's call, left VM and will have to call him back.

## 2022-08-04 NOTE — Telephone Encounter (Signed)
Called to speak with the patient to plan possible navigational bronchoscopy.  No answer.  Left him a voicemail.  I will try him back.

## 2022-08-05 ENCOUNTER — Telehealth: Payer: Self-pay | Admitting: Emergency Medicine

## 2022-08-05 NOTE — Telephone Encounter (Signed)
Reach the patient, discussed the CT scan of the chest and possible bronchoscopy.  We discussed the pros and cons, risks and benefits.  He does want to proceed with navigational bronchoscopy.  I will try to get him scheduled for 08/11/2022.  I should be able to use his high-resolution CT scan of the chest that was done in January to navigate

## 2022-08-06 ENCOUNTER — Encounter: Payer: Self-pay | Admitting: Emergency Medicine

## 2022-08-06 ENCOUNTER — Other Ambulatory Visit: Payer: Self-pay

## 2022-08-06 DIAGNOSIS — Z1152 Encounter for screening for COVID-19: Secondary | ICD-10-CM

## 2022-08-06 DIAGNOSIS — Z01812 Encounter for preprocedural laboratory examination: Secondary | ICD-10-CM

## 2022-08-07 ENCOUNTER — Other Ambulatory Visit: Payer: Medicare PPO

## 2022-08-07 DIAGNOSIS — Z1152 Encounter for screening for COVID-19: Secondary | ICD-10-CM | POA: Diagnosis not present

## 2022-08-07 DIAGNOSIS — Z01812 Encounter for preprocedural laboratory examination: Secondary | ICD-10-CM | POA: Diagnosis not present

## 2022-08-08 ENCOUNTER — Other Ambulatory Visit: Payer: Self-pay

## 2022-08-08 ENCOUNTER — Encounter (HOSPITAL_COMMUNITY): Payer: Self-pay | Admitting: Emergency Medicine

## 2022-08-08 NOTE — Progress Notes (Addendum)
I have called Mr. Javier Phillips numerous times today, home # rings busy, cell # , I have left several messages, patient has not called me back.  I called Dr. Agustina Caroli office and asked if there were any other numbers or contacts to call, the staff member I called said she will send Javier Phillips a My Chart message to call me and see if he responds to it.  I did get in touch with Javier Phillips, he denies chest[pain or shortness of breath.  Patient denies having any s/s of Covid in his household, also denies any known exposure to Covid.   Javier Phillips was Covid tested on 08/05/22, patient reported that he received a My Chart message that instructed  I'm to make an appointment to be test, so Tuesday was the day. Patient will arrive at 0915 to be tested.

## 2022-08-09 LAB — SPECIMEN STATUS REPORT

## 2022-08-09 LAB — NOVEL CORONAVIRUS, NAA: SARS-CoV-2, NAA: NOT DETECTED

## 2022-08-11 ENCOUNTER — Encounter (HOSPITAL_COMMUNITY)
Admission: RE | Disposition: A | Discharge status: Home / Self Care | Payer: Self-pay | Source: Ambulatory Visit | Attending: Emergency Medicine

## 2022-08-11 ENCOUNTER — Ambulatory Visit (HOSPITAL_COMMUNITY): Payer: Medicare PPO

## 2022-08-11 ENCOUNTER — Ambulatory Visit (HOSPITAL_COMMUNITY): Payer: Medicare PPO | Admitting: Certified Registered Nurse Anesthetist

## 2022-08-11 ENCOUNTER — Other Ambulatory Visit: Payer: Self-pay

## 2022-08-11 ENCOUNTER — Ambulatory Visit (HOSPITAL_BASED_OUTPATIENT_CLINIC_OR_DEPARTMENT_OTHER): Payer: Medicare PPO | Admitting: Certified Registered Nurse Anesthetist

## 2022-08-11 ENCOUNTER — Ambulatory Visit (HOSPITAL_COMMUNITY)
Admission: RE | Admit: 2022-08-11 | Discharge: 2022-08-11 | Disposition: A | Discharge status: Home / Self Care | Payer: Medicare PPO | Source: Ambulatory Visit | Attending: Emergency Medicine | Admitting: Emergency Medicine

## 2022-08-11 ENCOUNTER — Encounter (HOSPITAL_COMMUNITY): Payer: Self-pay | Admitting: Emergency Medicine

## 2022-08-11 DIAGNOSIS — R911 Solitary pulmonary nodule: Secondary | ICD-10-CM | POA: Diagnosis not present

## 2022-08-11 DIAGNOSIS — R918 Other nonspecific abnormal finding of lung field: Secondary | ICD-10-CM | POA: Diagnosis not present

## 2022-08-11 HISTORY — DX: Unspecified osteoarthritis, unspecified site: M19.90

## 2022-08-11 HISTORY — PX: BRONCHIAL BIOPSY: SHX5109

## 2022-08-11 HISTORY — PX: BRONCHIAL NEEDLE ASPIRATION BIOPSY: SHX5106

## 2022-08-11 HISTORY — DX: Anemia, unspecified: D64.9

## 2022-08-11 HISTORY — PX: BRONCHIAL WASHINGS: SHX5105

## 2022-08-11 HISTORY — DX: Pneumonia, unspecified organism: J18.9

## 2022-08-11 HISTORY — PX: BRONCHIAL BRUSHINGS: SHX5108

## 2022-08-11 LAB — CBC
HCT: 38.8 % — ABNORMAL LOW (ref 39.0–52.0)
Hemoglobin: 12.9 g/dL — ABNORMAL LOW (ref 13.0–17.0)
MCH: 29.1 pg (ref 26.0–34.0)
MCHC: 33.2 g/dL (ref 30.0–36.0)
MCV: 87.6 fL (ref 80.0–100.0)
Platelets: 207 10*3/uL (ref 150–400)
RBC: 4.43 MIL/uL (ref 4.22–5.81)
RDW: 15.4 % (ref 11.5–15.5)
WBC: 7.6 10*3/uL (ref 4.0–10.5)
nRBC: 0 % (ref 0.0–0.2)

## 2022-08-11 LAB — BASIC METABOLIC PANEL
Anion gap: 7 (ref 5–15)
BUN: 14 mg/dL (ref 8–23)
CO2: 30 mmol/L (ref 22–32)
Calcium: 8.8 mg/dL — ABNORMAL LOW (ref 8.9–10.3)
Chloride: 100 mmol/L (ref 98–111)
Creatinine, Ser: 1.28 mg/dL — ABNORMAL HIGH (ref 0.61–1.24)
GFR, Estimated: 58 mL/min — ABNORMAL LOW (ref >60)
Glucose, Bld: 92 mg/dL (ref 70–99)
Potassium: 3.7 mmol/L (ref 3.5–5.1)
Sodium: 137 mmol/L (ref 135–145)

## 2022-08-11 SURGERY — BRONCHOSCOPY, WITH BIOPSY USING ELECTROMAGNETIC NAVIGATION
Anesthesia: General

## 2022-08-11 MED ORDER — PROMETHAZINE HCL 25 MG/ML IJ SOLN: 6.2500 mg | INTRAMUSCULAR | Status: DC | PRN

## 2022-08-11 MED ORDER — CHLORHEXIDINE GLUCONATE 0.12 % MT SOLN
OROMUCOSAL | Status: AC
  Administered 2022-08-11: 15 mL
  Filled 2022-08-11: qty 15

## 2022-08-11 MED ORDER — VASOPRESSIN 20 UNIT/ML IV SOLN
INTRAVENOUS | Status: DC | PRN
  Administered 2022-08-11: 2 [IU] via INTRAVENOUS

## 2022-08-11 MED ORDER — OXYCODONE HCL 5 MG PO TABS: 5.0000 mg | ORAL_TABLET | Freq: Once | ORAL | Status: DC | PRN

## 2022-08-11 MED ORDER — ROCURONIUM BROMIDE 10 MG/ML (PF) SYRINGE
PREFILLED_SYRINGE | INTRAVENOUS | Status: DC | PRN
  Administered 2022-08-11: 30 mg via INTRAVENOUS
  Administered 2022-08-11: 10 mg via INTRAVENOUS

## 2022-08-11 MED ORDER — FENTANYL CITRATE (PF) 100 MCG/2ML IJ SOLN
INTRAMUSCULAR | Status: DC | PRN
  Administered 2022-08-11: 50 ug via INTRAVENOUS

## 2022-08-11 MED ORDER — PHENYLEPHRINE 80 MCG/ML (10ML) SYRINGE FOR IV PUSH (FOR BLOOD PRESSURE SUPPORT)
PREFILLED_SYRINGE | INTRAVENOUS | Status: DC | PRN
  Administered 2022-08-11: 160 ug via INTRAVENOUS
  Administered 2022-08-11: 240 ug via INTRAVENOUS
  Administered 2022-08-11: 160 ug via INTRAVENOUS
  Administered 2022-08-11: 240 ug via INTRAVENOUS

## 2022-08-11 MED ORDER — OXYCODONE HCL 5 MG/5ML PO SOLN: 5.0000 mg | Freq: Once | ORAL | Status: DC | PRN

## 2022-08-11 MED ORDER — EPHEDRINE SULFATE-NACL 50-0.9 MG/10ML-% IV SOSY
PREFILLED_SYRINGE | INTRAVENOUS | Status: DC | PRN
  Administered 2022-08-11: 10 mg via INTRAVENOUS

## 2022-08-11 MED ORDER — PHENYLEPHRINE HCL-NACL 20-0.9 MG/250ML-% IV SOLN
INTRAVENOUS | Status: DC | PRN
  Administered 2022-08-11: 75 ug/min via INTRAVENOUS

## 2022-08-11 MED ORDER — PROPOFOL 10 MG/ML IV BOLUS
INTRAVENOUS | Status: DC | PRN
  Administered 2022-08-11: 150 mg via INTRAVENOUS

## 2022-08-11 MED ORDER — ACETAMINOPHEN 10 MG/ML IV SOLN: 1000.0000 mg | Freq: Once | INTRAVENOUS | Status: DC | PRN

## 2022-08-11 MED ORDER — ONDANSETRON HCL 4 MG/2ML IJ SOLN
INTRAMUSCULAR | Status: DC | PRN
  Administered 2022-08-11: 4 mg via INTRAVENOUS

## 2022-08-11 MED ORDER — LACTATED RINGERS IV SOLN: INTRAVENOUS | Status: DC

## 2022-08-11 MED ORDER — DEXAMETHASONE SODIUM PHOSPHATE 10 MG/ML IJ SOLN
INTRAMUSCULAR | Status: DC | PRN
  Administered 2022-08-11: 10 mg via INTRAVENOUS

## 2022-08-11 MED ORDER — FENTANYL CITRATE (PF) 100 MCG/2ML IJ SOLN: 25.0000 ug | INTRAMUSCULAR | Status: DC | PRN

## 2022-08-11 MED ORDER — SUGAMMADEX SODIUM 200 MG/2ML IV SOLN
INTRAVENOUS | Status: DC | PRN
  Administered 2022-08-11: 200 mg via INTRAVENOUS

## 2022-08-11 MED ORDER — LIDOCAINE 2% (20 MG/ML) 5 ML SYRINGE
INTRAMUSCULAR | Status: DC | PRN
  Administered 2022-08-11: 60 mg via INTRAVENOUS

## 2022-08-11 NOTE — Anesthesia Postprocedure Evaluation (Signed)
Anesthesia Post Note  Patient: Javier Phillips  Procedure(s) Performed: ROBOTIC ASSISTED NAVIGATIONAL BRONCHOSCOPY BRONCHIAL BRUSHINGS BRONCHIAL NEEDLE ASPIRATION BIOPSIES BRONCHIAL BIOPSIES BRONCHIAL WASHINGS     Patient location during evaluation: PACU Anesthesia Type: General Level of consciousness: awake Pain management: pain level controlled Vital Signs Assessment: post-procedure vital signs reviewed and stable Respiratory status: spontaneous breathing, nonlabored ventilation and respiratory function stable Cardiovascular status: blood pressure returned to baseline and stable Postop Assessment: no apparent nausea or vomiting Anesthetic complications: no   No notable events documented.  Last Vitals:  Vitals:   08/11/22 1300 08/11/22 1315  BP: (!) 105/57 (!) 102/55  Pulse: 70 71  Resp: 20 17  Temp:  36.6 C  SpO2: 90% 95%    Last Pain:  Vitals:   08/11/22 1315  TempSrc:   PainSc: 0-No pain                 Anuar Walgren P Zina Pitzer

## 2022-08-11 NOTE — Transfer of Care (Signed)
Immediate Anesthesia Transfer of Care Note  Patient: [place]  Procedure(s) Performed: ROBOTIC ASSISTED NAVIGATIONAL BRONCHOSCOPY BRONCHIAL BRUSHINGS BRONCHIAL NEEDLE ASPIRATION BIOPSIES BRONCHIAL BIOPSIES BRONCHIAL WASHINGS  Patient Location: PACU  Anesthesia Type:General  Level of Consciousness: drowsy  Airway & Oxygen Therapy: Patient Spontanous Breathing and Patient connected to face mask oxygen  Post-op Assessment: Report given to RN and Post -op Vital signs reviewed and stable  Post vital signs: Reviewed and stable  Last Vitals:  Vitals Value Taken Time  BP 121/52 08/11/22 1228  Temp    Pulse 69 08/11/22 1230  Resp 15 08/11/22 1230  SpO2 95 % 08/11/22 1230  Vitals shown include unvalidated device data.  Last Pain:  Vitals:   08/11/22 1023  TempSrc:   PainSc: 0-No pain         Complications: No notable events documented.

## 2022-08-11 NOTE — Discharge Instructions (Signed)
Flexible Bronchoscopy, Care After This sheet gives you information about how to care for yourself after your test. Your doctor may also give you more specific instructions. If you have problems or questions, contact your doctor. Follow these instructions at home: Eating and drinking When your numbness is gone and your cough and gag reflexes have come back, you may: Eat only soft foods. Slowly drink liquids. The day after the test, go back to your normal diet. Driving Do not drive for 24 hours if you were given a medicine to help you relax (sedative). Do not drive or use heavy machinery while taking prescription pain medicine. General instructions  Take over-the-counter and prescription medicines only as told by your doctor. Return to your normal activities as told. Ask what activities are safe for you. Do not use any products that have nicotine or tobacco in them. This includes cigarettes and e-cigarettes. If you need help quitting, ask your doctor. Keep all follow-up visits as told by your doctor. This is important. It is very important if you had a tissue sample (biopsy) taken. Get help right away if: You have shortness of breath that gets worse. You get light-headed. You feel like you are going to pass out (faint). You have chest pain. You cough up: More than a little blood. More blood than before. Summary Do not eat or drink anything (not even water) for 2 hours after your test, or until your numbing medicine wears off. Do not use cigarettes. Do not use e-cigarettes. Get help right away if you have chest pain.  Please call our office for questions or concerns.  [phone].  This information is not intended to replace advice given to you by your health care provider. Make sure you discuss any questions you have with your health care provider. Document Released: 03/30/2009 Document Revised: 05/15/2017 Document Reviewed: 06/20/2016 Elsevier Patient Education  2020 [employer].

## 2022-08-11 NOTE — Anesthesia Procedure Notes (Signed)
Procedure Name: Intubation Date/Time: 08/11/2022 11:35 AM  Performed by: Colin Benton, CRNAPre-anesthesia Checklist: Patient identified, Emergency Drugs available, Suction available and Patient being monitored Patient Re-evaluated:Patient Re-evaluated prior to induction Oxygen Delivery Method: Circle system utilized Preoxygenation: Pre-oxygenation with 100% oxygen Induction Type: IV induction Ventilation: Mask ventilation without difficulty Laryngoscope Size: Miller and 2 Grade View: Grade I Tube type: Oral Tube size: 8.5 mm Number of attempts: 1 Airway Equipment and Method: Stylet Placement Confirmation: ETT inserted through vocal cords under direct vision, positive ETCO2 and breath sounds checked- equal and bilateral Secured at: 24 cm Tube secured with: Tape Dental Injury: Teeth and Oropharynx as per pre-operative assessment

## 2022-08-11 NOTE — Anesthesia Preprocedure Evaluation (Addendum)
Anesthesia Evaluation  Patient identified by MRN, date of birth, ID band Patient awake    Reviewed: Allergy & Precautions, NPO status , Patient's Chart, lab work & pertinent test results  History of Anesthesia Complications Negative for: history of anesthetic complications  Airway Mallampati: III  TM Distance: >3 FB Neck ROM: Full    Dental  (+) Dental Advisory Given   Pulmonary neg shortness of breath, neg sleep apnea, neg COPD, neg recent URI LLL pulmonary nodule, ILD   Pulmonary exam normal breath sounds clear to auscultation       Cardiovascular (-) hypertension(-) angina + DOE  (-) Past MI, (-) Cardiac Stents and (-) CABG + Valvular Problems/Murmurs MVP  Rhythm:Regular Rate:Normal  TTE 10/19/2020: IMPRESSIONS     1. Left ventricular ejection fraction, by estimation, is 55 to 60%. The  left ventricle has normal function. The left ventricle has no regional  wall motion abnormalities. Left ventricular diastolic parameters are  consistent with Grade I diastolic  dysfunction (impaired relaxation).   2. Right ventricular systolic function is normal. The right ventricular  size is normal.   3. The mitral valve is abnormal. Trivial mitral valve regurgitation.   4. The aortic valve is tricuspid. Aortic valve regurgitation trivial to  mild. Mild aortic valve sclerosis is present, with no evidence of aortic  valve stenosis.     Neuro/Psych negative neurological ROS     GI/Hepatic negative GI ROS, Neg liver ROS,,,  Endo/Other  negative endocrine ROS    Renal/GU negative Renal ROS     Musculoskeletal  (+) Arthritis ,    Abdominal   Peds  Hematology  (+) Blood dyscrasia, anemia   Anesthesia Other Findings   Reproductive/Obstetrics                              Anesthesia Physical Anesthesia Plan  ASA: 2  Anesthesia Plan: General   Post-op Pain Management: Minimal or no pain  anticipated   Induction: Intravenous  PONV Risk Score and Plan: 2 and Ondansetron and Treatment may vary due to age or medical condition  Airway Management Planned: Oral ETT  Additional Equipment:   Intra-op Plan:   Post-operative Plan: Extubation in OR  Informed Consent: I have reviewed the patients History and Physical, chart, labs and discussed the procedure including the risks, benefits and alternatives for the proposed anesthesia with the patient or authorized representative who has indicated his/her understanding and acceptance.     Dental advisory given  Plan Discussed with: CRNA and Anesthesiologist  Anesthesia Plan Comments: (Risks of general anesthesia discussed including, but not limited to, sore throat, hoarse voice, chipped/damaged teeth, injury to vocal cords, nausea and vomiting, allergic reactions, lung infection, heart attack, stroke, and death. All questions answered. )         Anesthesia Quick Evaluation

## 2022-08-11 NOTE — H&P (Signed)
Javier Phillips is an 77 y.o. male.   Chief Complaint: Pulmonary nodule left lung HPI: 76 year old man with mitral valve prolapse, GERD, positive rheumatoid factor and elevated ANA (unclear etiology) who has been followed by Javier Phillips in our office for interstitial lung disease.  He is a never smoker.  He has a CT scan of the chest with principally a UIP pattern.  Serial CT imaging shows a progressive increase in the left lung peripheral nodular opacity at the interface of the left upper and left lower lobe.  Recommendation made to achieve a tissue diagnosis via robotic assisted navigational bronchoscopy.  He presents for this test today.  He understands the rationale, risks, benefits and agrees to proceed.  No new issues reported.  No barriers identified.  Past Medical History:  Diagnosis Date   Anemia    Arthritis    Enlarged prostate    Heart murmur    Diagnosis of MVP years ago   Interstitial cystitis    Interstitial lung disease ([place])    Pneumonia     Past Surgical History:  Procedure Laterality Date   HERNIA REPAIR      Family History  Problem Relation Age of Onset   Stroke Mother    Dementia Father    Dementia Brother    Social History:  reports that he has never smoked. He has never used smokeless tobacco. He reports that he does not currently use alcohol. He reports that he does not use drugs.  Allergies:  Allergies  Allergen Reactions   Aspirin Other (See Comments)    upset stomach     Nsaids Other (See Comments)    Stomach issues    Medications Prior to Admission  Medication Sig Dispense Refill   acetaminophen (TYLENOL) 325 MG tablet Take 650 mg by mouth every 6 (six) hours as needed. For fever     albuterol (VENTOLIN HFA) 108 (90 Base) MCG/ACT inhaler Inhale 1-2 puffs into the lungs every 4 (four) hours as needed for wheezing or shortness of breath.     amitriptyline (ELAVIL) 10 MG tablet Take 1 tablet by mouth daily.     cetirizine (ZYRTEC) 10 MG tablet  Take 10 mg by mouth daily.     oxybutynin (DITROPAN-XL) 10 MG 24 hr tablet Take 10 mg by mouth daily.     sodium chloride (OCEAN) 0.65 % SOLN nasal spray Place 1 spray into both nostrils as needed for congestion.     Tamsulosin HCl (FLOMAX) 0.4 MG CAPS Take 0.4 mg by mouth in the morning and at bedtime.      No results found for this or any previous visit (from the past 48 hour(s)). No results found.  Review of Systems As per HPI Blood pressure (!) 157/69, pulse 69, temperature 97.9 F (36.6 C), temperature source Oral, resp. rate 18, height 5' 4.5" (1.638 m), weight 59 kg, SpO2 98 %. Physical Exam  Gen: Pleasant, well-nourished, in no distress,  normal affect  ENT: No lesions,  mouth clear,  oropharynx clear, no postnasal drip  Neck: No JVD, no stridor  Lungs: No use of accessory muscles, few scattered inspiratory crackles bilaterally  Cardiovascular: RRR, heart sounds normal, no murmur or gallops, no peripheral edema  Abdomen: soft and NT, no HSM,  BS normal  Musculoskeletal: No deformities, no cyanosis or clubbing  Neuro: alert, awake, non focal  Skin: Warm, no lesions or rashes  \ Assessment/Plan Left sided peripheral pulmonary nodule at the interface of the left upper and  left lower lobes superimposed on a background of interstitial lung disease.  Plan for robotic assisted navigational bronchoscopy and biopsies today.  No barriers identified.  Javier Gobble, MD 08/11/2022, 11:08 AM

## 2022-08-11 NOTE — Op Note (Signed)
Video Bronchoscopy with Robotic Assisted Bronchoscopic Navigation   Date of Operation: 08/11/2022   Pre-op Diagnosis: Right upper lobe pulmonary nodule  Post-op Diagnosis: Same  Surgeon: Baltazar Apo  Assistants: None  Anesthesia: General endotracheal anesthesia  Operation: Flexible video fiberoptic bronchoscopy with robotic assistance and biopsies.  Estimated Blood Loss: Minimal  Complications: None  Indications and History: Javier Phillips is a 77 y.o. male with history of interstitial lung disease.  He has a slowly enlarging pulmonary nodule superimposed on his ILD.  Recommendation made to achieve tissue diagnosis and culture data via robotic assisted navigational bronchoscopy. The risks, benefits, complications, treatment options and expected outcomes were discussed with the patient.  The possibilities of pneumothorax, pneumonia, reaction to medication, pulmonary aspiration, perforation of a viscus, bleeding, failure to diagnose a condition and creating a complication requiring transfusion or operation were discussed with the patient who freely signed the consent.    Description of Procedure: The patient was seen in the Preoperative Area, was examined and was deemed appropriate to proceed.  The patient was taken to [facility] endoscopy room 3, identified as Javier Phillips and the procedure verified as Flexible Video Fiberoptic Bronchoscopy.  A Time Out was held and the above information confirmed.   Prior to the date of the procedure a high-resolution CT scan of the chest was performed. Utilizing ION software program a virtual tracheobronchial tree was generated to allow the creation of distinct navigation pathways to the patient's parenchymal abnormalities. After being taken to the operating room general anesthesia was initiated and the patient  was orally intubated. The video fiberoptic bronchoscope was introduced via the endotracheal tube and a general inspection was performed which  showed normal right and left lung anatomy. Aspiration of the bilateral mainstems was completed to remove any remaining secretions. Robotic catheter inserted into patient's endotracheal tube.   Target #1 left upper lobe pulmonary nodule: The distinct navigation pathways prepared prior to this procedure were then utilized to navigate to patient's lesion identified on CT scan. The robotic catheter was secured into place and the vision probe was withdrawn.  Lesion location was approximated using fluoroscopy.  Local registration and targeting was performed using Cios three-dimensional imaging.  Under fluoroscopic guidance transbronchial brushings, transbronchial needle biopsies, and transbronchial forceps biopsies were performed to be sent for cytology and pathology. A bronchioalveolar lavage was performed in the left upper lobe and sent for cytology.   At the end of the procedure a general airway inspection was performed and there was no evidence of active bleeding. The bronchoscope was removed.  The patient tolerated the procedure well. There was no significant blood loss and there were no obvious complications. A post-procedural chest x-ray is pending.  Samples Target #1: 1. Transbronchial brushings from left upper lobe nodule 2. Transbronchial Wang needle biopsies from left upper lobe nodule 3. Transbronchial forceps biopsies from left upper lobe nodule 4. Bronchoalveolar lavage from left upper lobe   Plans:  The patient will be discharged from the PACU to home when recovered from anesthesia and after chest x-ray is reviewed. We will review the cytology, pathology and microbiology results with the patient when they become available. Outpatient followup will be with Dr. Vaughan Browner.   Baltazar Apo, MD, PhD 08/11/2022, 12:24 PM [facility] [phone] or if no answer before 7:00PM call [phone] For any issues after 7:00PM please call eLink [phone]

## 2022-08-13 ENCOUNTER — Encounter (HOSPITAL_COMMUNITY): Payer: Self-pay | Admitting: Emergency Medicine

## 2022-08-13 LAB — ACID FAST SMEAR (AFB, MYCOBACTERIA): Acid Fast Smear: NEGATIVE

## 2022-08-13 LAB — CYTOLOGY - NON PAP

## 2022-08-13 LAB — CULTURE, BAL-QUANTITATIVE W GRAM STAIN: Culture: NO GROWTH

## 2022-08-15 ENCOUNTER — Telehealth: Payer: Self-pay | Admitting: Emergency Medicine

## 2022-08-15 NOTE — Telephone Encounter (Signed)
Reviewed bronchoscopy cytology results with the patient by phone.  No evidence of malignancy.  Reassured him about this.  His culture data is still pending.

## 2022-08-16 LAB — AEROBIC/ANAEROBIC CULTURE W GRAM STAIN (SURGICAL/DEEP WOUND)

## 2022-08-19 ENCOUNTER — Telehealth: Payer: Self-pay | Admitting: Emergency Medicine

## 2022-08-19 ENCOUNTER — Telehealth: Payer: Self-pay | Admitting: Pulmonary Disease

## 2022-08-19 MED ORDER — AZITHROMYCIN 250 MG PO TABS: ORAL_TABLET | ORAL | 0 refills | Status: DC

## 2022-08-19 MED ORDER — PREDNISONE 20 MG PO TABS: ORAL_TABLET | ORAL | 0 refills | Status: DC

## 2022-08-19 NOTE — Telephone Encounter (Signed)
Please call in Z-Pak and prednisone 40 mg a day for 5 days

## 2022-08-19 NOTE — Telephone Encounter (Signed)
Called and spoke to pt. Pt c/o sinus congestion, pt is unable to blow out much mucus but when he does it is light yellow to light green, sinus headache and sinus pressure. Pt also c/o worsening dry cough, ShOB, chest heaviness. Pt states he had an oral temp of 100.5, pt took tylenol and is now afebrile. Pt uses [facility] on [address]. Pt would like to be called back on [phone].   Dr. Vaughan Browner, please advise. Thanks.

## 2022-08-19 NOTE — Telephone Encounter (Signed)
Pt callin in bc he is having sinus inf and needs something sent into his pharmacy.

## 2022-08-19 NOTE — Telephone Encounter (Signed)
Called and spoke with patient.  Dr. Matilde Bash recommendations given.  Understanding stated.  Prescriptions sent to requested [facility].  Nothing further at this time.

## 2022-09-25 LAB — ACID FAST CULTURE WITH REFLEXED SENSITIVITIES (MYCOBACTERIA): Acid Fast Culture: NEGATIVE

## 2022-10-01 NOTE — Telephone Encounter (Signed)
Seems like encounter was open in error so closing encounter.  

## 2022-11-25 DIAGNOSIS — Z6821 Body mass index (BMI) 21.0-21.9, adult: Secondary | ICD-10-CM | POA: Diagnosis not present

## 2022-11-25 DIAGNOSIS — Z0000 Encounter for general adult medical examination without abnormal findings: Secondary | ICD-10-CM | POA: Diagnosis not present

## 2022-12-08 DIAGNOSIS — L570 Actinic keratosis: Secondary | ICD-10-CM | POA: Diagnosis not present

## 2022-12-08 DIAGNOSIS — L820 Inflamed seborrheic keratosis: Secondary | ICD-10-CM | POA: Diagnosis not present

## 2022-12-15 DIAGNOSIS — Z6821 Body mass index (BMI) 21.0-21.9, adult: Secondary | ICD-10-CM | POA: Diagnosis not present

## 2022-12-15 DIAGNOSIS — M62838 Other muscle spasm: Secondary | ICD-10-CM | POA: Diagnosis not present

## 2022-12-17 DIAGNOSIS — Z0000 Encounter for general adult medical examination without abnormal findings: Secondary | ICD-10-CM | POA: Diagnosis not present

## 2022-12-17 DIAGNOSIS — D649 Anemia, unspecified: Secondary | ICD-10-CM | POA: Diagnosis not present

## 2022-12-17 DIAGNOSIS — Z6821 Body mass index (BMI) 21.0-21.9, adult: Secondary | ICD-10-CM | POA: Diagnosis not present

## 2022-12-17 DIAGNOSIS — R944 Abnormal results of kidney function studies: Secondary | ICD-10-CM | POA: Diagnosis not present

## 2022-12-17 DIAGNOSIS — J8410 Pulmonary fibrosis, unspecified: Secondary | ICD-10-CM | POA: Diagnosis not present

## 2022-12-23 DIAGNOSIS — J342 Deviated nasal septum: Secondary | ICD-10-CM | POA: Diagnosis not present

## 2022-12-23 DIAGNOSIS — H6981 Other specified disorders of Eustachian tube, right ear: Secondary | ICD-10-CM | POA: Diagnosis not present

## 2022-12-23 DIAGNOSIS — J310 Chronic rhinitis: Secondary | ICD-10-CM | POA: Diagnosis not present

## 2022-12-23 DIAGNOSIS — H9011 Conductive hearing loss, unilateral, right ear, with unrestricted hearing on the contralateral side: Secondary | ICD-10-CM | POA: Diagnosis not present

## 2022-12-23 DIAGNOSIS — J343 Hypertrophy of nasal turbinates: Secondary | ICD-10-CM | POA: Diagnosis not present

## 2023-01-03 IMAGING — CT CT CHEST HIGH RESOLUTION W/O CM
2 of 7 series · 14 of 36 positions shown, 17 images · non-contrast
Comparison: 09/07/2020 chest radiograph.

CLINICAL DATA: Dyspnea and nonproductive cough for 5 months.

EXAM:
CT CHEST WITHOUT CONTRAST
TECHNIQUE: Multidetector CT imaging of the chest was performed following the
standard protocol without intravenous contrast. High resolution
imaging of the lungs, as well as inspiratory and expiratory imaging,
was performed.

[Series 4: high resolution · axial · 0.58mm/px · z∈[-300,-40]mm · 11 of 312 slices shown, 14 images]
[im 26/312  mediastinal]
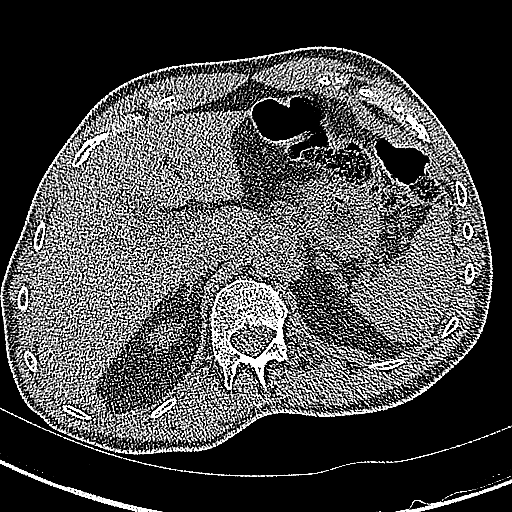
[im 26/312  lung]
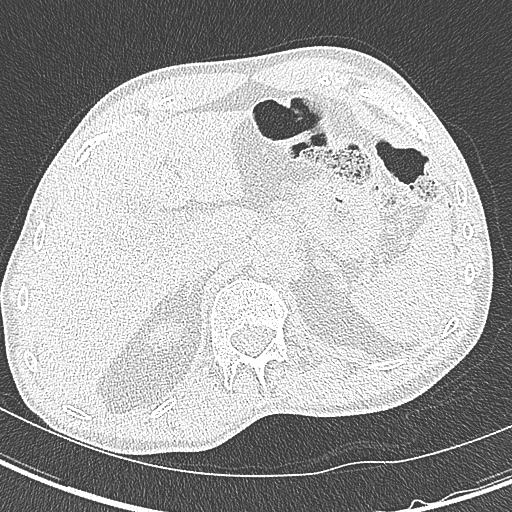
[im 52/312  lung]
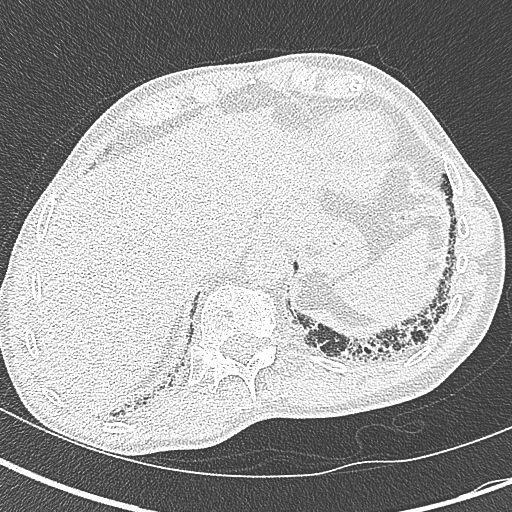
[im 78/312  lung]
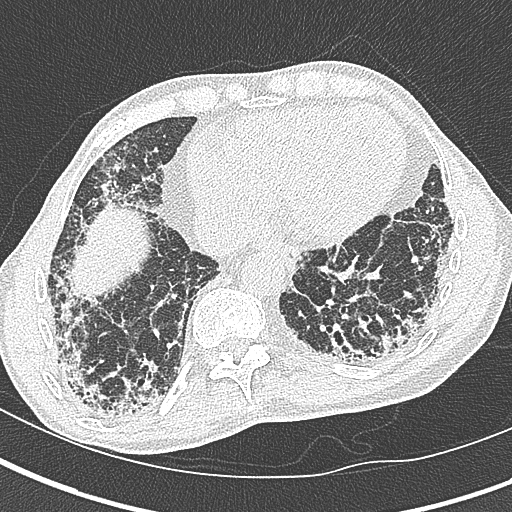
[im 104/312  lung]
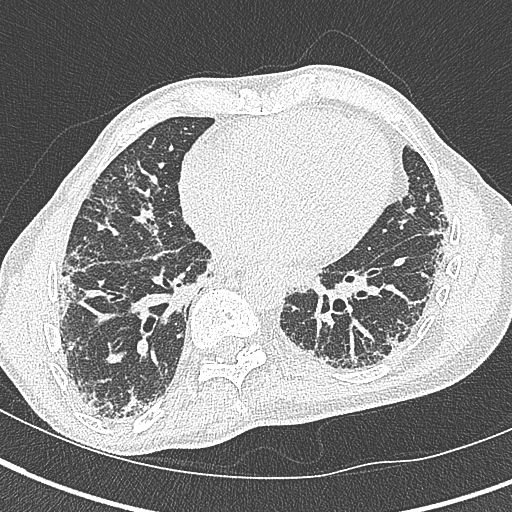
[im 130/312  mediastinal]
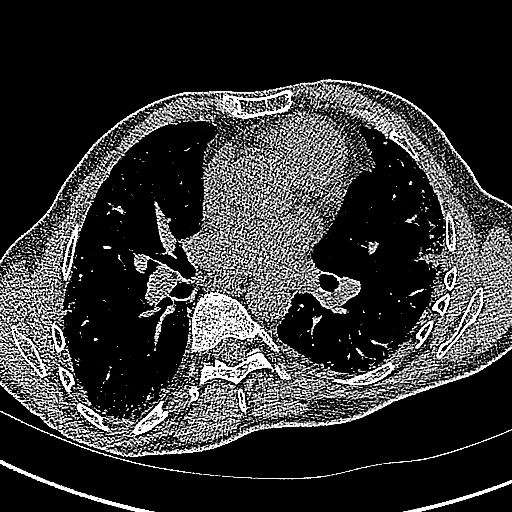
[im 130/312  lung]
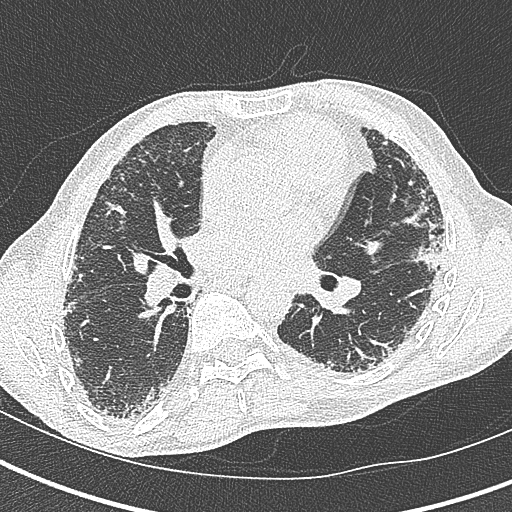
[im 156/312  lung]
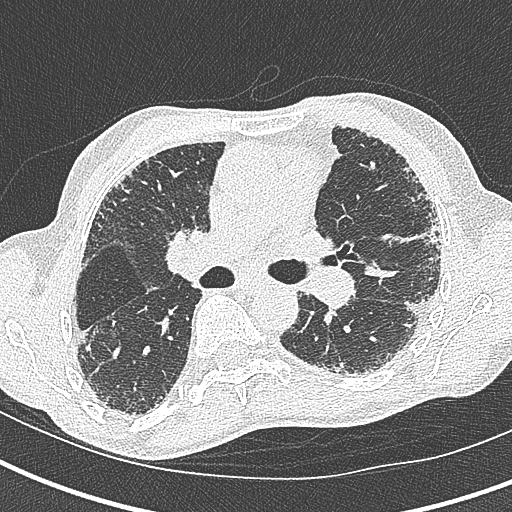
[im 182/312  lung]
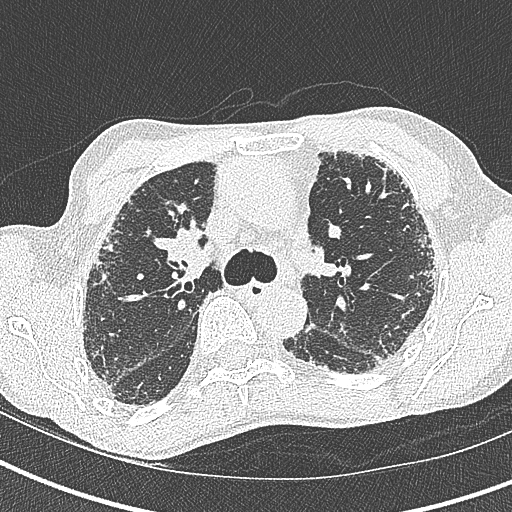
[im 208/312  lung]
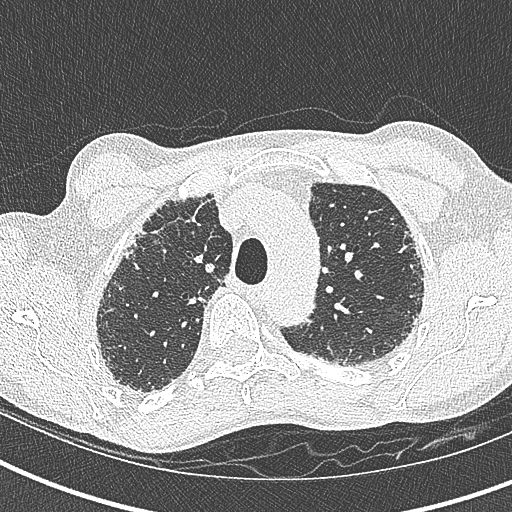
[im 234/312  mediastinal]
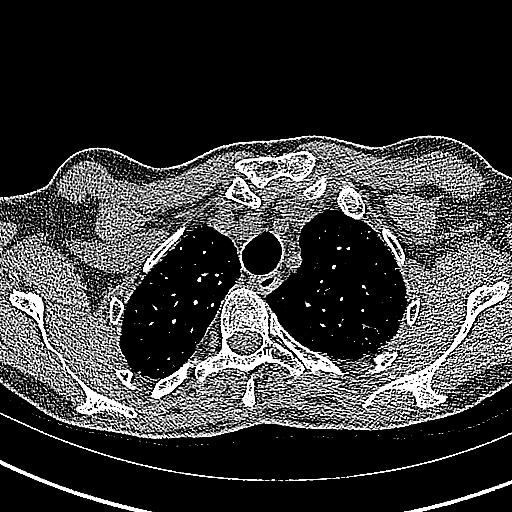
[im 234/312  lung]
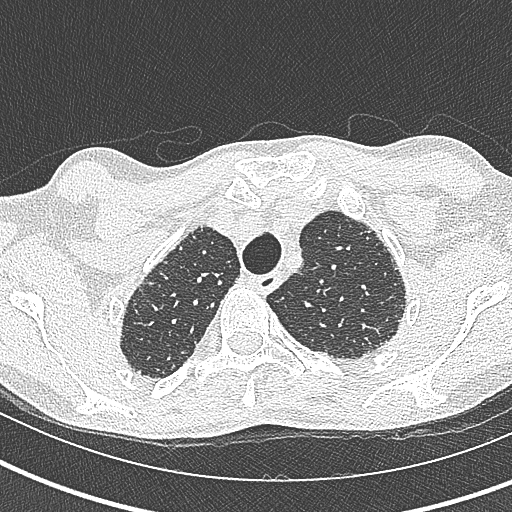
[im 260/312  lung]
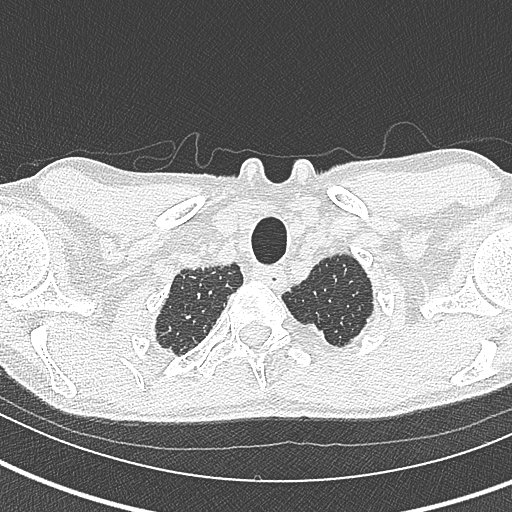
[im 286/312  lung]
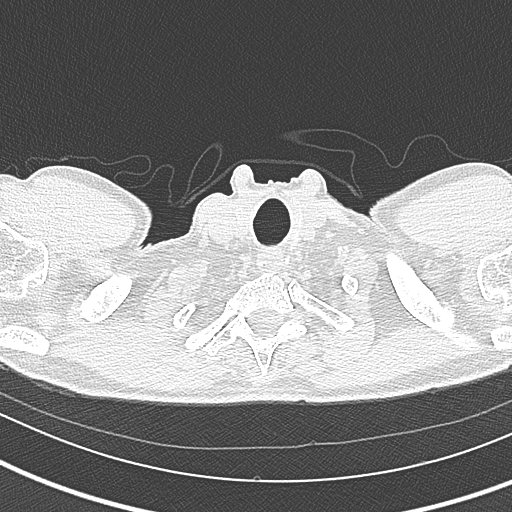

[Series 7: coronal · coronal · 0.56mm/px · 3 of 101 slices shown]
[im 21/101  lung]
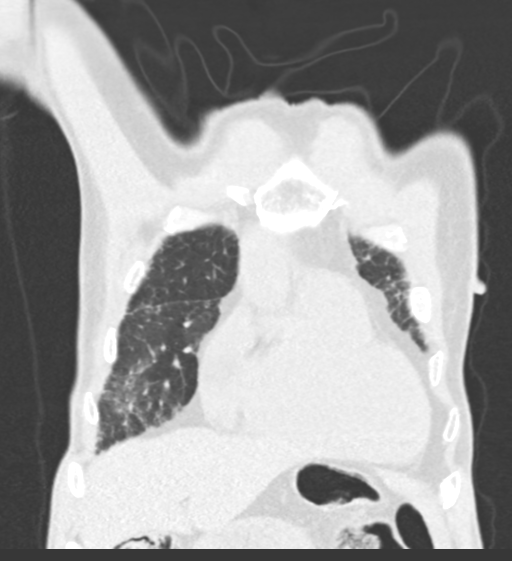
[im 41/101  lung]
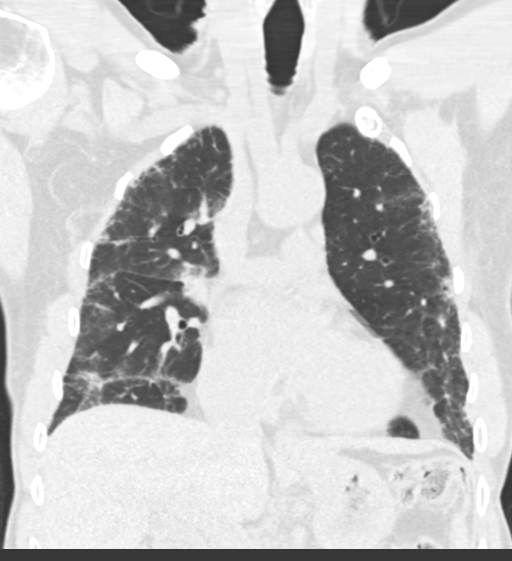
[im 61/101  lung]
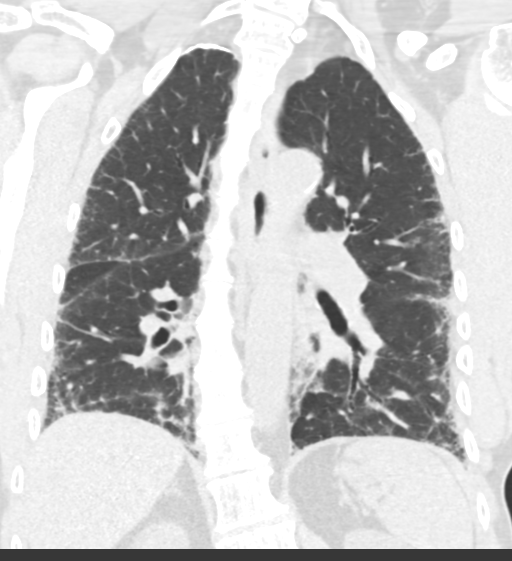

[14 of 36 positions shown; findings below may reference images not displayed]

FINDINGS: Cardiovascular: Borderline mild cardiomegaly. No significant
pericardial effusion/thickening. Left anterior descending coronary
atherosclerosis. Atherosclerotic nonaneurysmal thoracic aorta.
Normal caliber pulmonary arteries.

Mediastinum/Nodes: No discrete thyroid nodules. Unremarkable
esophagus. No pathologically enlarged axillary, mediastinal or hilar
lymph nodes, noting limited sensitivity for the detection of hilar
adenopathy on this noncontrast study.

Lungs/Pleura: No pneumothorax. No pleural effusion. No acute
consolidative airspace disease, lung masses or significant pulmonary
nodules. No significant air trapping or evidence of
tracheobronchomalacia. Prominent patchy confluent subpleural
reticulation, septal thickening and mild ground-glass opacity
throughout both lungs with associated mild-to-moderate traction
bronchiectasis, architectural distortion and volume loss. Possible
early honeycombing in the dependent lower lobes bilaterally (series
4/image 235 on the right and 222 on the left).

Upper abdomen: No acute abnormality.

Musculoskeletal: No aggressive appearing focal osseous lesions.
Minimal thoracic spondylosis.
IMPRESSION: 1. Spectrum of findings compatible with basilar predominant fibrotic
interstitial lung disease with possible early honeycombing in the
dependent lower lobes bilaterally. Findings are categorized as
probable UIP per consensus guidelines: Diagnosis of Idiopathic
Pulmonary Fibrosis: An Official ATS/ERS/JRS/ALAT Clinical Practice
Guideline. Am J Respir Crit Care Med Vol 198, [place] 5, ppe22-e[DATE]. Borderline mild cardiomegaly.
3. One vessel coronary atherosclerosis.
4. Aortic Atherosclerosis ([id number].X).

## 2023-01-14 DIAGNOSIS — M542 Cervicalgia: Secondary | ICD-10-CM | POA: Diagnosis not present

## 2023-01-16 DIAGNOSIS — M542 Cervicalgia: Secondary | ICD-10-CM | POA: Diagnosis not present

## 2023-01-23 DIAGNOSIS — M542 Cervicalgia: Secondary | ICD-10-CM | POA: Diagnosis not present

## 2023-01-28 DIAGNOSIS — M542 Cervicalgia: Secondary | ICD-10-CM | POA: Diagnosis not present

## 2023-01-30 DIAGNOSIS — M542 Cervicalgia: Secondary | ICD-10-CM | POA: Diagnosis not present

## 2023-02-04 DIAGNOSIS — M542 Cervicalgia: Secondary | ICD-10-CM | POA: Diagnosis not present

## 2023-02-11 DIAGNOSIS — M542 Cervicalgia: Secondary | ICD-10-CM | POA: Diagnosis not present

## 2023-02-18 DIAGNOSIS — M542 Cervicalgia: Secondary | ICD-10-CM | POA: Diagnosis not present

## 2023-02-20 DIAGNOSIS — M542 Cervicalgia: Secondary | ICD-10-CM | POA: Diagnosis not present

## 2023-02-23 ENCOUNTER — Ambulatory Visit: Payer: Medicare PPO | Admitting: Pulmonary Disease

## 2023-02-23 ENCOUNTER — Encounter: Payer: Self-pay | Admitting: Pulmonary Disease

## 2023-02-23 VITALS — BP 118/80 | HR 66 | Ht 65.5 in | Wt 130.6 lb

## 2023-02-23 DIAGNOSIS — J84112 Idiopathic pulmonary fibrosis: Secondary | ICD-10-CM

## 2023-02-23 NOTE — Progress Notes (Signed)
Javier Phillips    [id number]    Apr 10, 1946  Primary Care Physician:Ross, Darlen Round, MD  Referring Physician: Daisy Floro, MD [address] [place],  [place] [address]  Chief complaint: Follow-up for interstitial lung disease  HPI: 77 y.o.  with history of mitral valve prolapse, GERD Complains of increasing dyspnea on exertion for the past 4 months.  This is associated with a dry hacking cough.  He is on albuterol and cough drops which help.  Cough is exacerbated by exposure to cigarette smoke, freshly cut grass  Stays active with exercise at [facility], swimming but this has been limited somewhat recently Has followed with Dr. Antoine Poche for mitral valve prolapse  Denies any joint pain, rash.  He has dry eyes.  Occasional dysphagia with choking on dry crackers.   Has seen Dr. Dimple Casey for elevated ANA in 2022.  Findings are not felt to be secondary to connective tissue disease.  Additional serologic tests were ordered which are negative  He has also seen speech pathology in 2022 for modified barium swallow with findings of mild aspiration risk but no actual witnessed aspiration Completed pulmonary rehab in 2023  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  Wife uses a down pillow.   ILD questionnaire 11/27/20-negative except for above Smoking history: Never smoker Travel history: No significant travel history Relevant family history: Has family history of asthma and emphysema  Interim history:  He underwent robotic navigational bronchoscopy on 08/11/2022 by Dr. Delton Coombes for evaluation of left lung nodule.  The pathology was benign Feels well with no issues.  States that breathing is doing well.  Outpatient Encounter Medications as of 02/23/2023  Medication Sig   acetaminophen (TYLENOL) 325 MG tablet Take 650 mg by mouth every 6 (six) hours as needed. For  fever   amitriptyline (ELAVIL) 10 MG tablet Take 1 tablet by mouth daily.   cetirizine (ZYRTEC) 10 MG tablet Take 10 mg by mouth daily.   fluticasone (FLONASE) 50 MCG/ACT nasal spray SW AND USE 2 SPRAYS IN EACH NOSTRIL QD.   methocarbamol (ROBAXIN) 750 MG tablet Take by mouth.   oxybutynin (DITROPAN-XL) 10 MG 24 hr tablet Take 10 mg by mouth daily.   sodium chloride (OCEAN) 0.65 % SOLN nasal spray Place 1 spray into both nostrils as needed for congestion.   Tamsulosin HCl (FLOMAX) 0.4 MG CAPS Take 0.4 mg by mouth in the morning and at bedtime.   albuterol (VENTOLIN HFA) 108 (90 Base) MCG/ACT inhaler Inhale 1-2 puffs into the lungs every 4 (four) hours as needed for wheezing or shortness of breath. (Patient not taking: Reported on 02/23/2023)   azithromycin (ZITHROMAX) 250 MG tablet Take as directed (Patient not taking: Reported on 02/23/2023)   predniSONE (DELTASONE) 20 MG tablet Take 40mg  for 5 days (Patient not taking: Reported on 02/23/2023)   No facility-administered encounter medications on file as of 02/23/2023.   Physical Exam: Blood pressure 118/80, pulse 66, height 5' 5.5" (1.664 m), weight 130 lb 9.6 oz (59.2 kg), SpO2 97%. Gen:      No acute distress HEENT:  EOMI, sclera anicteric Neck:     No masses; no thyromegaly Lungs:    Clear to auscultation bilaterally; normal respiratory effort CV:         Regular rate and rhythm; no murmurs Abd:      +  bowel sounds; soft, non-tender; no palpable masses, no distension Ext:    No edema; adequate peripheral perfusion Skin:      Warm and dry; no rash Neuro: alert and oriented x 3 Psych: normal mood and affect   Data Reviewed: Imaging: Chest x-ray 09/10/2020- interstitial changes at the lung base suggestive of pulmonary fibrosis.   High-resolution CT 10/19/2020-basilar predominant fibrotic lung disease and probable UIP pattern  High-resolution CT 07/13/2021-stable pattern of pulmonary fibrosis in probable UIP pattern  High resolution CT  06/19/2022-slight progression in pulmonary fibrosis.  Development of solid 1.4 cm nodular focus of consolidation in the left lung.  PET/CT 07/04/2022-low-level uptake in consolidation of left lung Interstitial lung disease I have reviewed the images personally  PFTs: 11/27/2020 FVC 2.14 (61%), FEV1 1.97 (79%), F/F 92, TLC 3.86 (63%), DLCO 19.14 (88%] Moderate restriction  10/03/2021 FVC 2.13 [62%], FEV1 1.96 [80%], F/F 92, TLC 3.61 [59%] Severe restriction  07/08/2022 FVC 2.16 [63%], FEV1 1.94 [80%], F/F 90, TLC 3.50 [57%], DLCO 11.92 [55%] Severe restriction, diffusion defect  Labs: Labs from primary care 09/07/2020 BNP -23.5 Comprehensive metabolic panel-normal with normal liver function CBC WBC 8.3, hemoglobin 13.2, platelets 152, 0% eos  CTD serologies 10/05/2020 ANA 1:640, nuclear speckled, positive myositis antibody, rheumatoid factor 22  Assessment:  Interstitial lung disease 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  We had a detailed discussion in office today with him about antifibrotic therapy, risks and benefits.  Discussed that CT and PFTs are slightly worse.  He wants to avoid treatment due to gastrointestinal side effects and will continue to think about it  Lung nodule His  imaging shows progressive increase in nodular peripheral opacity in the left lung with no significant uptake on PET.   Benign pathology noted on robotic navigational biopsy Will get a follow-up CT for evaluation in the next month.  Plan/Recommendations: Continue discussion about antifibrotic therapy High resolution CT at next  available  Chilton Greathouse MD [facility] 02/23/2023, 1:40 PM  CC: Daisy Floro, MD

## 2023-02-23 NOTE — Patient Instructions (Signed)
Glad you are doing well with your breathing Will order CT high-resolution at next available for reassessment of the lungs Follow-up in 6 months

## 2023-02-23 NOTE — Addendum Note (Signed)
Addended by: Lanna Poche on: 02/23/2023 02:59 PM   Modules accepted: Orders

## 2023-02-25 DIAGNOSIS — M542 Cervicalgia: Secondary | ICD-10-CM | POA: Diagnosis not present

## 2023-02-27 ENCOUNTER — Encounter: Payer: Self-pay | Admitting: Pulmonary Disease

## 2023-03-04 DIAGNOSIS — M542 Cervicalgia: Secondary | ICD-10-CM | POA: Diagnosis not present

## 2023-03-10 ENCOUNTER — Ambulatory Visit
Admission: RE | Admit: 2023-03-10 | Discharge: 2023-03-10 | Disposition: A | Discharge status: Home / Self Care | Payer: Medicare PPO | Source: Ambulatory Visit | Attending: Pulmonary Disease

## 2023-03-10 DIAGNOSIS — R918 Other nonspecific abnormal finding of lung field: Secondary | ICD-10-CM | POA: Diagnosis not present

## 2023-03-10 DIAGNOSIS — J849 Interstitial pulmonary disease, unspecified: Secondary | ICD-10-CM | POA: Diagnosis not present

## 2023-03-10 DIAGNOSIS — I700 Atherosclerosis of aorta: Secondary | ICD-10-CM | POA: Diagnosis not present

## 2023-03-10 DIAGNOSIS — J84112 Idiopathic pulmonary fibrosis: Secondary | ICD-10-CM

## 2023-03-11 DIAGNOSIS — M542 Cervicalgia: Secondary | ICD-10-CM | POA: Diagnosis not present

## 2023-03-24 ENCOUNTER — Other Ambulatory Visit: Payer: Self-pay | Admitting: Pulmonary Disease

## 2023-03-24 ENCOUNTER — Encounter: Payer: Self-pay | Admitting: Pulmonary Disease

## 2023-03-24 DIAGNOSIS — C44219 Basal cell carcinoma of skin of left ear and external auricular canal: Secondary | ICD-10-CM | POA: Diagnosis not present

## 2023-03-24 DIAGNOSIS — J84112 Idiopathic pulmonary fibrosis: Secondary | ICD-10-CM

## 2023-03-24 DIAGNOSIS — L578 Other skin changes due to chronic exposure to nonionizing radiation: Secondary | ICD-10-CM | POA: Diagnosis not present

## 2023-03-24 DIAGNOSIS — L814 Other melanin hyperpigmentation: Secondary | ICD-10-CM | POA: Diagnosis not present

## 2023-03-24 DIAGNOSIS — C4441 Basal cell carcinoma of skin of scalp and neck: Secondary | ICD-10-CM | POA: Diagnosis not present

## 2023-03-24 DIAGNOSIS — L821 Other seborrheic keratosis: Secondary | ICD-10-CM | POA: Diagnosis not present

## 2023-03-24 DIAGNOSIS — L570 Actinic keratosis: Secondary | ICD-10-CM | POA: Diagnosis not present

## 2023-03-24 NOTE — [facility] Notes (Signed)
PCCM note  Called and discussed results of CT scan dated 03/10/2023 There is minimal progression of pulmonary fibrosis.  Nodule in the left major fissure is stable.  This has already been biopsied earlier this year with benign findings  Patient would like to continue conservative management.  Will order a follow-up high-resolution CT in 1 year.  Chilton Greathouse MD [facility] 03/24/2023, 2:38 PM

## 2023-03-25 DIAGNOSIS — M542 Cervicalgia: Secondary | ICD-10-CM | POA: Diagnosis not present

## 2023-03-26 DIAGNOSIS — H2513 Age-related nuclear cataract, bilateral: Secondary | ICD-10-CM | POA: Diagnosis not present

## 2023-03-26 DIAGNOSIS — H5213 Myopia, bilateral: Secondary | ICD-10-CM | POA: Diagnosis not present

## 2023-04-01 DIAGNOSIS — M542 Cervicalgia: Secondary | ICD-10-CM | POA: Diagnosis not present

## 2023-04-20 DIAGNOSIS — Z85828 Personal history of other malignant neoplasm of skin: Secondary | ICD-10-CM | POA: Diagnosis not present

## 2023-04-20 DIAGNOSIS — C44219 Basal cell carcinoma of skin of left ear and external auricular canal: Secondary | ICD-10-CM | POA: Diagnosis not present

## 2023-06-04 DIAGNOSIS — N3010 Interstitial cystitis (chronic) without hematuria: Secondary | ICD-10-CM | POA: Diagnosis not present

## 2023-06-04 DIAGNOSIS — R351 Nocturia: Secondary | ICD-10-CM | POA: Diagnosis not present

## 2023-06-04 DIAGNOSIS — N401 Enlarged prostate with lower urinary tract symptoms: Secondary | ICD-10-CM | POA: Diagnosis not present

## 2023-06-30 DIAGNOSIS — H2513 Age-related nuclear cataract, bilateral: Secondary | ICD-10-CM | POA: Diagnosis not present

## 2023-07-21 DIAGNOSIS — L738 Other specified follicular disorders: Secondary | ICD-10-CM | POA: Diagnosis not present

## 2023-07-21 DIAGNOSIS — L570 Actinic keratosis: Secondary | ICD-10-CM | POA: Diagnosis not present

## 2023-07-21 DIAGNOSIS — Z85828 Personal history of other malignant neoplasm of skin: Secondary | ICD-10-CM | POA: Diagnosis not present

## 2023-07-21 DIAGNOSIS — D0422 Carcinoma in situ of skin of left ear and external auricular canal: Secondary | ICD-10-CM | POA: Diagnosis not present

## 2023-07-28 DIAGNOSIS — D0422 Carcinoma in situ of skin of left ear and external auricular canal: Secondary | ICD-10-CM | POA: Diagnosis not present

## 2023-07-28 DIAGNOSIS — Z85828 Personal history of other malignant neoplasm of skin: Secondary | ICD-10-CM | POA: Diagnosis not present

## 2023-08-11 DIAGNOSIS — L821 Other seborrheic keratosis: Secondary | ICD-10-CM | POA: Diagnosis not present

## 2023-08-11 DIAGNOSIS — L570 Actinic keratosis: Secondary | ICD-10-CM | POA: Diagnosis not present

## 2023-08-11 DIAGNOSIS — Z85828 Personal history of other malignant neoplasm of skin: Secondary | ICD-10-CM | POA: Diagnosis not present

## 2023-08-12 DIAGNOSIS — H2181 Floppy iris syndrome: Secondary | ICD-10-CM | POA: Diagnosis not present

## 2023-08-12 DIAGNOSIS — H2512 Age-related nuclear cataract, left eye: Secondary | ICD-10-CM | POA: Diagnosis not present

## 2023-08-12 DIAGNOSIS — H25812 Combined forms of age-related cataract, left eye: Secondary | ICD-10-CM | POA: Diagnosis not present

## 2023-08-13 ENCOUNTER — Telehealth: Payer: Self-pay | Admitting: Internal Medicine

## 2023-08-13 NOTE — Telephone Encounter (Signed)
 PT dropped off a envelope w/application for Pulmonix I think. He said to put it in Dr. Charlean Sanfilippo box.

## 2023-09-15 ENCOUNTER — Encounter: Admitting: Internal Medicine

## 2023-09-15 DIAGNOSIS — Z006 Encounter for examination for normal comparison and control in clinical research program: Secondary | ICD-10-CM

## 2023-09-15 DIAGNOSIS — J84112 Idiopathic pulmonary fibrosis: Secondary | ICD-10-CM

## 2023-09-15 NOTE — Research (Signed)
 Title: A Phase 2 Multicenter, Randomized, Double-blind, Placebo Controlled Study to Evaluate the Safety, Tolerability, Pharmacokinetics, and Efficacy of TTI-101 in Participants with Idiopathic Pulmonary Fibrosis    Dose and Duration of Treatment: 75 participants will be enrolled using a 1:1:1 randomization ratio over a 20 week period (4 week screening period, 12 week treatment period and a 4 week follow-up period).   25 participants: TTI-101 400 mg/day   25 participants: TTI-101 800 mg/day   25 participants: placebo   Protocol # TVD-101-003P    Sponsor: [employer], [employer] [address] [place], [place] [address] [place]  Protocol Version/Amendment: 4.0, date  21 Jan 2023,  for 09/15/2023 date   Consent Version: ver. Revised 23 Apr 2023  for 09/15/2023   Investigator Brochure Version: Edition Number 8.0  25 Apr 2022  Investigator Brochure Product: TTI-101        Mechanism of action: TTI-101 is a small-molecule inhibitor of STAT3 (signal transducer and activator of transcription 3). STAT3 plays a major role in the cellular processes involved in fibrosis including fibroblast proliferation. TTI-101 binds tightly to STAT3 which prevents STAT3 recruitment to signaling complexes that contain activated tyrosine kinases, thereby preventing STAT3 phosphorylation on Y705 and STAT3 activation. Binding of TTI-101 to STAT3 also prevents STAT3 dimerization. As such, TTI-101 is expected to prevent or reverse progression of fibrosis in IPF patients.  Key Inclusion Criteria: Age >= 40 years  Diagnosed with IPF based on the 2018 or 2022 ATS/ERS/JRS/ALAT guidelines within 5 years  Chest HRCT performed within 12 months meeting requirements for IPF Extent of fibrosis > emphysematous changes on the HRCT Meeting all the following during screening confirmed by central review,   >40% of predicted FVC and a (FEV1)/FVC >=0.7  A predicted DLCO (Hb corrected) >25%  SpO2 >= 88% with up to 4L  O2/min by pulse oximetry at rest  If currently receiving nintedanib, stable dose for >3 months  Key Exclusion Criteria: Known to have the following diseases at screening: Uncontrolled pulmonary hypertension, or pulmonary hypertension requiring chronic medical treatment Congestive heart failure - NYHA Class III or IV   Drug-induced interstitial pneumonia, pneumoconiosis, hypersensitivity pneumonitis, or radiation pneumonitis   Collagen vascular disease with involvement of the lung, autoimmune disease with involvement of the lung, sarcoidosis, granulomatous disease Severe hypertension (BP >= 160/100 despite maximal therapy within 3 months of visit 1) Myocardial infarction, unstable cardiac angina, or history of thrombotic event within 6 months of screening QTcF >450 msec for men and >470 msec for women at screening.   History of significant/symptomatic bradycardia long QT syndrome or  Uncorrected hypomagnesemia or hypokalemia or Heart rate <50 bpm at rest   Unresolved respiratory tract infection within 4 weeks or an acute exacerbation of IPF within 3 months  Active cancer or a history of cancer with a significant risk of recurrence during the study.  eGFR <=37mL/min/1.73 m2, Hb<=10g/dL, MWU<1324/MW1 , platelets <100,000/mm3 , serum total bilirubin >1.5 x ULN, liver enzymes >= 2 x ULN Receiving steroids > 10 mg/day of prednisolone or its equivalent within 2 weeks   Immunosuppressive agents within 4 weeks  Received pirfenidone within 3 months, smoking or vaping within 3 months   Pharmacokinetics: (ADME) - Contraindications: The SEDD formulation for TTI-101 provided greater systemic exposures relative to the Labrasol/PEG formulation. The SEDD formulation decreased the pill burden while increasing systemic exposures. The half-life of the drug in participants generally ranged from 4-8 h. The half-life data are supportive of the BID dose schedule. The  administration of TTI-101 is limited to  investigational use only and limited to the oral route of administration. TTI-101 is contraindicated for use in participants with known hypersensitivity to any of the excipients. Special Warnings/Considerations: Insufficient experience exists with TTI-101 to provide comprehensive warning guidance beyond what is standard for any investigational drug.  Interactions:  Studies indicated the potential for strong induction of CYP1A2 by TTI-101. As expected, TTI-101 did induce metabolism of pirfenidone, known to be primarily metabolized by CYP1A2. There was no interaction shown between TTI-101 and nintedanib. Drug Interaction Studies: [facility] conducted a DDI study in normal healthy volunteers given TTI-101 and either pirfenidone or nintedanib. Serious Adverse Reactions Observed in Clinical studies: 32 participants out of 105 reported SAE. (30.5%). However all SAEs reported were from study 2016-0842 (participants with solid tumors). No SAEs were reported in the DDI study (Healthy volunteer). Majority of SAE were GI disorders.  Serious Adverse Reactions Observed Postmarketing: To date, TTI-101 has not been registered for use or marketed in any jurisdiction. Safety Data:  The safety data from study 2016-0842 demonstrates the most frequent AEs are GI-related ( nausea, vomiting, change in appetite, diarrhea, abdominal pain) and related to liver function (elevated AST and ALT).  Fewer AEs were reported with the SDD formulation. EKG - PR prolongation was seen in 1 out of 105 participants. (1%)    Overall Adverse events in total participants N=105 (%) Severity  Grade1-mild Grade2-Moderate Grade3-Severe Formulation 1  N=15 Formulation 2    N=47 Formulation 3 (SDD)  N=48*  Diarrhea 30 (28.6%) 24 (22.9%) 7 (6.7%) 8 (7.6%) 6 (40.0%) 21 (44.7%) 3 (6.3%)  Nausea 14 (13.3%) 9 (8.6%) 4 (3.8%) 1 (1.0%) 4 (26.7%) 7 (14.9%) 3 (6.3%)  Vomiting 7 (6.7%) 5 (4.8%) 2 (1.9%) - 3 (20.0%) 2 (4.3%) 2 (4.2%)    Abdominal pain 3 (2.9%) 2 (1.9%) 1 (1.0%) - 3 (20.0%) - -  Elevated ALT 7 (6.7%)   4 (3.8%)   3 (2.9%)   3 (2.9%) 2 (13.3%)  4 (8.5%)   1 (2.1%)   Elevated AST 6 (5.7%)   2 (1.9%)   4 (3.8%)   3 (2.9%) 1 (6.7%)  4 (8.5%)  1 (2.1%)   Headache 5 (4.8%) 5 (4.8%) - - - - 5 (10.4%)    TEAE from DDI study alone with Healthy Volunteer N=41 Part 1 nintedanib Part 2 Pirfenidone  Diarrhea  2 (4.9%)     1 (4.8%)  1 (5.0%)  Nausea 2 (4.9%)   1 (4.8%)   1 (5.0%)   Vomiting 1 (2.4%)     -  1 (5.0%)   Abdominal distension 1 (2.4%)     1 (4.8%)  -  Headache 5 (12.2%)   4 (19.0%)  1 (5.0%)    Stability:  The recommended storage condition for TTI-101 tablet, 200 mg and matching placebo is room temperature (20C to 25C or 62F to 62F). Excursions between 15C and 30C (31F and 56F) are allowed.    [facility] [place] Clinical Research Coordinator note:   This visit for Subject Javier Phillips with DOB: [birth year] on 09/15/2023 for the above protocol is Visit/Encounter # screening visit  and is for purpose of research.   The consent for this encounter is under Protocol Version 4.0, Investigator Brochure Version 8.0, Consent Version revised and  is currently IRB approved.   Subject expressed continued interest and consent in continuing as a study subject. Subject confirmed that there was     change in contact information (  e.g. address, telephone, email). Subject thanked for participation in research and contribution to science. In this visit 09/15/2023 the subject will be evaluated by Sub-Investigator named Marga Melnick MD. This research coordinator has verified that the above investigator is  up to date with his/her training logs.   The Subject was  informed that the PI  continues to have oversight of the subject's visits and course through relevant discussions, reviews, and also specifically of this visit by routing of this note to the PI.   1. This visit is a key visit of screening.. The  PI is not  available for this visit.    Subject's questions were answered and consent was signed.  All screening visit assessments were completed.  Due to coughing, DLCO will need to be repeated.Please see subject binder for further information.  Signed by  Christell Constant MD  Clinical Research Coordinator  [facility]  [place], [place] 12:42 PM 09/15/2023

## 2023-09-16 NOTE — Progress Notes (Signed)
 Javier Phillips, DOB Nov 08, 1945, was seen as subject in a clinical trial /Protocol # TVD-101-003P.  He has a diagnosis of rheumatoid arthritis.  ANA was positive at a titer of 1:640 on 10/05/2020.  This was evaluated 12/06/2020 by Dr. Dimple Casey, Rheumatologist, who did not believe a systemic autoimmune disease process was present and the interstitial fibrosis was not related to rheumatoid arthritis.  He also has had past medical history of normochromic, normocytic anemia and CKD stage IIIa. Cardiopulmonary symptoms are essentially stable with any progression described as "gradual." Other or new symptoms include diarrhea which is described as loose to watery stools 2 weeks after beginning eyedrops following cataract surgery. Pertinent physical findings include: He is thin and appears suboptimally nourished. (Note: On 05/26/2011 albumin was 2.5, with normal value 3.5-5.2 and total protein 6.4, normal 6-8.3) Second heart sound is increased with slight splitting.  There is a brief systolic murmur noted.  Rales are noted at the lower one third of the posterior thorax. There is insignificant clubbing of the nailbeds.  Limb atrophy and interosseous wasting are suggested. While sitting asymmetry of the cervical musculature with visible prominence greater on the right is noted ,somewhat suggesting torticollis.  This is in the context of suggestion of thoracic scoliosis.  When supine the apparent cervical muscular asymmetry resolves. All physical findings NCS                                                                     Pecola Lawless MD,SI

## 2023-09-17 DIAGNOSIS — Z6821 Body mass index (BMI) 21.0-21.9, adult: Secondary | ICD-10-CM | POA: Diagnosis not present

## 2023-09-17 DIAGNOSIS — R197 Diarrhea, unspecified: Secondary | ICD-10-CM | POA: Diagnosis not present

## 2023-09-18 ENCOUNTER — Encounter: Payer: Self-pay | Admitting: Internal Medicine

## 2023-09-18 DIAGNOSIS — J8410 Pulmonary fibrosis, unspecified: Secondary | ICD-10-CM

## 2023-09-18 DIAGNOSIS — Z006 Encounter for examination for normal comparison and control in clinical research program: Secondary | ICD-10-CM | POA: Insufficient documentation

## 2023-09-18 NOTE — Research (Signed)
 Title: A Phase 2 Multicenter, Randomized, Double-blind, Placebo Controlled Study to Evaluate the Safety, Tolerability, Pharmacokinetics, and Efficacy of TTI-101 in Participants with Idiopathic Pulmonary Fibrosis    Dose and Duration of Treatment: 75 participants will be enrolled using a 1:1:1 randomization ratio over a 20 week period (4 week screening period, 12 week treatment period and a 4 week follow-up period).   25 participants: TTI-101 400 mg/day   25 participants: TTI-101 800 mg/day   25 participants: placebo   Protocol # TVD-101-003P    Sponsor: [employer], [address] [employer] [address] [place], [place] [address] [place]  Protocol Version/Amendment: 4.0, date  21 Jan 2023,  for 09/18/2023 date   Consent Version: ver. Revised 23 Apr 2023  for 09/18/2023 date   Investigator Brochure Version: Edition Number 8.0   25 Apr 2022  Investigator Brochure Product: TTI-101        Mechanism of action: TTI-101 is a small-molecule inhibitor of STAT3 (signal transducer and activator of transcription 3). STAT3 plays a major role in the cellular processes involved in fibrosis including fibroblast proliferation. TTI-101 binds tightly to STAT3 which prevents STAT3 recruitment to signaling complexes that contain activated tyrosine kinases, thereby preventing STAT3 phosphorylation on Y705 and STAT3 activation. Binding of TTI-101 to STAT3 also prevents STAT3 dimerization. As such, TTI-101 is expected to prevent or reverse progression of fibrosis in IPF patients.  Key Inclusion Criteria: Age >= 40 years  Diagnosed with IPF based on the 2018 or 2022 ATS/ERS/JRS/ALAT guidelines within 5 years  Chest HRCT performed within 12 months meeting requirements for IPF Extent of fibrosis > emphysematous changes on the HRCT Meeting all the following during screening confirmed by central review,   >40% of predicted FVC and a (FEV1)/FVC >=0.7  A predicted DLCO (Hb corrected) >25%  SpO2 >= 88% with up to 4L  O2/min by pulse oximetry at rest  If currently receiving nintedanib, stable dose for >3 months  Key Exclusion Criteria: Known to have the following diseases at screening: Uncontrolled pulmonary hypertension, or pulmonary hypertension requiring chronic medical treatment Congestive heart failure - NYHA Class III or IV   Drug-induced interstitial pneumonia, pneumoconiosis, hypersensitivity pneumonitis, or radiation pneumonitis   Collagen vascular disease with involvement of the lung, autoimmune disease with involvement of the lung, sarcoidosis, granulomatous disease Severe hypertension (BP >= 160/100 despite maximal therapy within 3 months of visit 1) Myocardial infarction, unstable cardiac angina, or history of thrombotic event within 6 months of screening QTcF >450 msec for men and >470 msec for women at screening.   History of significant/symptomatic bradycardia long QT syndrome or  Uncorrected hypomagnesemia or hypokalemia or Heart rate <50 bpm at rest   Unresolved respiratory tract infection within 4 weeks or an acute exacerbation of IPF within 3 months  Active cancer or a history of cancer with a significant risk of recurrence during the study.  eGFR <=59mL/min/1.73 m2, Hb<=10g/dL, UEA<5409/WJ1 , platelets <100,000/mm3 , serum total bilirubin >1.5 x ULN, liver enzymes >= 2 x ULN Receiving steroids > 10 mg/day of prednisolone or its equivalent within 2 weeks   Immunosuppressive agents within 4 weeks  Received pirfenidone within 3 months, smoking or vaping within 3 months   Pharmacokinetics: (ADME) - Contraindications: The SEDD formulation for TTI-101 provided greater systemic exposures relative to the Labrasol/PEG formulation. The SEDD formulation decreased the pill burden while increasing systemic exposures. The half-life of the drug in participants generally ranged from 4-8 h. The half-life data are supportive of the BID dose  schedule. The administration of TTI-101 is limited to  investigational use only and limited to the oral route of administration. TTI-101 is contraindicated for use in participants with known hypersensitivity to any of the excipients. Special Warnings/Considerations: Insufficient experience exists with TTI-101 to provide comprehensive warning guidance beyond what is standard for any investigational drug.  Interactions:  Studies indicated the potential for strong induction of CYP1A2 by TTI-101. As expected, TTI-101 did induce metabolism of pirfenidone, known to be primarily metabolized by CYP1A2. There was no interaction shown between TTI-101 and nintedanib. Drug Interaction Studies: [facility] conducted a DDI study in normal healthy volunteers given TTI-101 and either pirfenidone or nintedanib. Serious Adverse Reactions Observed in Clinical studies: 32 participants out of 105 reported SAE. (30.5%). However all SAEs reported were from study 2016-0842 (participants with solid tumors). No SAEs were reported in the DDI study (Healthy volunteer). Majority of SAE were GI disorders.  Serious Adverse Reactions Observed Postmarketing: To date, TTI-101 has not been registered for use or marketed in any jurisdiction. Safety Data:  The safety data from study 2016-0842 demonstrates the most frequent AEs are GI-related ( nausea, vomiting, change in appetite, diarrhea, abdominal pain) and related to liver function (elevated AST and ALT).  Fewer AEs were reported with the SDD formulation. EKG - PR prolongation was seen in 1 out of 105 participants. (1%)    Overall Adverse events in total participants N=105 (%) Severity  Grade1-mild Grade2-Moderate Grade3-Severe Formulation 1  N=15 Formulation 2    N=47 Formulation 3 (SDD)  N=48*  Diarrhea 30 (28.6%) 24 (22.9%) 7 (6.7%) 8 (7.6%) 6 (40.0%) 21 (44.7%) 3 (6.3%)  Nausea 14 (13.3%) 9 (8.6%) 4 (3.8%) 1 (1.0%) 4 (26.7%) 7 (14.9%) 3 (6.3%)  Vomiting 7 (6.7%) 5 (4.8%) 2 (1.9%) - 3 (20.0%) 2 (4.3%) 2 (4.2%)    Abdominal pain 3 (2.9%) 2 (1.9%) 1 (1.0%) - 3 (20.0%) - -  Elevated ALT 7 (6.7%)   4 (3.8%)   3 (2.9%)   3 (2.9%) 2 (13.3%)  4 (8.5%)   1 (2.1%)   Elevated AST 6 (5.7%)   2 (1.9%)   4 (3.8%)   3 (2.9%) 1 (6.7%)  4 (8.5%)  1 (2.1%)   Headache 5 (4.8%) 5 (4.8%) - - - - 5 (10.4%)    TEAE from DDI study alone with Healthy Volunteer N=41 Part 1 nintedanib Part 2 Pirfenidone  Diarrhea  2 (4.9%)     1 (4.8%)  1 (5.0%)  Nausea 2 (4.9%)   1 (4.8%)   1 (5.0%)   Vomiting 1 (2.4%)     -  1 (5.0%)   Abdominal distension 1 (2.4%)     1 (4.8%)  -  Headache 5 (12.2%)   4 (19.0%)  1 (5.0%)    Stability:  The recommended storage condition for TTI-101 tablet, 200 mg and matching placebo is room temperature (20C to 25C or 66F to 52F). Excursions between 15C and 30C (39F and 79F) are allowed.     Visit Note: Subject returned for unscheduled visit to repeat spirometry and DLCO.  Assessments completed.  No new symptoms or problems.  Please see subject binder for details.    Christell Constant MD

## 2023-10-09 DIAGNOSIS — Z961 Presence of intraocular lens: Secondary | ICD-10-CM | POA: Diagnosis not present

## 2023-10-12 ENCOUNTER — Telehealth: Payer: Self-pay | Admitting: Internal Medicine

## 2023-10-12 NOTE — Telephone Encounter (Cosign Needed)
 Telephone call to Javier Phillips to let me know [facility] does not have sufficient staffing to enroll him in [facility] IPF research study. [facility] should have new studies open to enrollment this summer and we can contact patient or he may call office to inquire this summer. Apologized and thanked patient for his time during screening visits.

## 2023-11-16 ENCOUNTER — Encounter: Payer: Self-pay | Admitting: Internal Medicine

## 2023-11-16 ENCOUNTER — Ambulatory Visit: Admitting: Internal Medicine

## 2023-11-16 VITALS — BP 137/78 | HR 68 | Ht 65.0 in | Wt 124.6 lb

## 2023-11-16 DIAGNOSIS — J310 Chronic rhinitis: Secondary | ICD-10-CM | POA: Diagnosis not present

## 2023-11-16 DIAGNOSIS — R634 Abnormal weight loss: Secondary | ICD-10-CM | POA: Diagnosis not present

## 2023-11-16 DIAGNOSIS — J84112 Idiopathic pulmonary fibrosis: Secondary | ICD-10-CM

## 2023-11-16 MED ORDER — IPRATROPIUM BROMIDE 0.03 % NA SOLN: 2.0000 | Freq: Three times a day (TID) | NASAL | 12 refills | Status: AC | PRN

## 2023-11-16 NOTE — Progress Notes (Signed)
 Javier Phillips    [id number]    May 23, 1946  Primary Care Physician:Ross, Laretta Pleasure, MD Date of Appointment: 11/16/2023 Established Patient Visit  Chief complaint:   Chief Complaint  Patient presents with   Acute Visit    Nausea and weight loss     HPI: Javier Phillips is a 78 y.o. man with IPF, not on anti-fibrotic therapy, who follows with Dr. Waylan Haggard.  Interval Updates: Here for acute visit for nausea and weight loss. He has brought me two piece of paper front and back with his symptoms over the last several months.   Discussed the use of AI scribe software for clinical note transcription with the patient, who gave verbal consent to proceed.  History of Present Illness Javier Phillips is a 78 year old male with idiopathic pulmonary fibrosis who presents with worsening cough and gastrointestinal symptoms. He is accompanied by his wife and father. He was referred by Dr. Nevada Barbara for evaluation of his idiopathic pulmonary fibrosis.  He has been experiencing a worsening cough over the past several months, which he attributes to increased nasal drainage. The drainage has been almost constant for the last four to five months. He currently uses saline solution two to three times a day and lung compresses a couple of times a day to manage the symptoms. He does not use Flonase , although it is on his medication list, and he plans to restart it. He also takes Zyrtec (cetirizine) daily for allergies, which he describes as being bad year-round.  He reports significant weight loss, noting a decrease from 128.6 pounds at the time of his cataract surgery to 124.6 pounds recently, although he mentions a slight increase in appetite and weight over the past week. He has experienced some shortness of breath.  He was diagnosed with idiopathic pulmonary fibrosis approximately three years ago. He has chosen not to start treatment due to concerns about side effects, particularly  gastrointestinal issues, which he feels would negatively impact his quality of life. He mentions that his lung function has remained relatively unchanged over recent tests.  He has a history of gastrointestinal symptoms, which have influenced his decision to avoid certain medications for idiopathic pulmonary fibrosis due to potential side effects like diarrhea. He is concerned about the impact of these side effects on his quality of life.  He has been involved in discussions about participating in clinical trials, although he was not accepted into a recent study due to filled recruitment. He is open to considering future trials.   I have reviewed the patient's family social and past medical history and updated as appropriate.   Past Medical History:  Diagnosis Date   Anemia    Arthritis    Enlarged prostate    Heart murmur    Diagnosis of MVP years ago   Interstitial cystitis    Interstitial lung disease (HCC)    Pneumonia     Past Surgical History:  Procedure Laterality Date   BRONCHIAL BIOPSY  08/11/2022   Procedure: BRONCHIAL BIOPSIES;  Surgeon: Denson Flake, MD;  Location: [facility] ENDOSCOPY;  Service: Pulmonary;;   BRONCHIAL BRUSHINGS  08/11/2022   Procedure: BRONCHIAL BRUSHINGS;  Surgeon: Denson Flake, MD;  Location: [facility] ENDOSCOPY;  Service: Pulmonary;;   BRONCHIAL NEEDLE ASPIRATION BIOPSY  08/11/2022   Procedure: BRONCHIAL NEEDLE ASPIRATION BIOPSIES;  Surgeon: Denson Flake, MD;  Location: [facility] ENDOSCOPY;  Service: Pulmonary;;   BRONCHIAL WASHINGS  08/11/2022   Procedure:  BRONCHIAL WASHINGS;  Surgeon: Denson Flake, MD;  Location[facility] ENDOSCOPY;  Service: Pulmonary;;   HERNIA REPAIR      Family History  Problem Relation Age of Onset   Stroke Mother    Dementia Father    Dementia Brother     Social History   Occupational History   Not on file  Tobacco Use   Smoking status: Never   Smokeless tobacco: Never  Vaping Use   Vaping status: Never Used  Substance and  Sexual Activity   Alcohol use: Not Currently    Comment: years ago   Drug use: Never   Sexual activity: Yes     Physical Exam: Blood pressure 137/78, pulse 68, height 5\' 5"  (1.651 m), weight 124 lb 9.6 oz (56.5 kg), SpO2 97%.  Gen:      No acute distress, thin, elderly ENT:  +cobblestoning, no nasal polyps, mucus membranes moist Lungs:   bibasilar crackles CV:         Regular rate and rhythm; no murmurs, rubs, or gallops.  No pedal edema   Data Reviewed: Imaging: I have personally reviewed the CT Chest Nov 2024 which shows subpleural reticulation and traction bronchiectasis with craniocaudal gradient c/w IPF.   PFTs:     Latest Ref Rng & Units 07/08/2022   10:49 AM 10/03/2021    9:43 AM 11/27/2020    8:56 AM  PFT Results  FVC-Pre L 2.08  1.72  2.05   FVC-Predicted Pre % 61  50  59   FVC-Post L 2.16  2.13  2.14   FVC-Predicted Post % 63  62  61   Pre FEV1/FVC % % 89  85  92   Post FEV1/FCV % % 90  92  92   FEV1-Pre L 1.85  1.46  1.89   FEV1-Predicted Pre % 76  59  75   FEV1-Post L 1.94  1.96  1.97   DLCO uncorrected ml/min/mmHg 11.92   19.14   DLCO UNC% % 55   88   DLCO corrected ml/min/mmHg 11.92   19.14   DLCO COR %Predicted % 55   88   DLVA Predicted % 84   120   TLC L 3.50  3.61  3.86   TLC % Predicted % 57  59  63   RV % Predicted % 61  57  65    I have personally reviewed the patient's PFTs and moderate restriction to ventilation FVC 61% of predicted   Labs: ANA positive, RF positive Lab Results  Component Value Date   NA 137 08/11/2022   K 3.7 08/11/2022   CO2 30 08/11/2022   GLUCOSE 92 08/11/2022   BUN 14 08/11/2022   CREATININE 1.28 (H) 08/11/2022   CALCIUM 8.8 (L) 08/11/2022   GFRNONAA 58 (L) 08/11/2022   Lab Results  Component Value Date   WBC 7.6 08/11/2022   HGB 12.9 (L) 08/11/2022   HCT 38.8 (L) 08/11/2022   MCV 87.6 08/11/2022   PLT 207 08/11/2022    Immunization status: Immunization History  Administered Date(s) Administered    Fluad Quad(high Dose 65+) 04/15/2021   Influenza Split 05/26/2011   Influenza, High Dose Seasonal PF 04/21/2018, 04/15/2019   Influenza,inj,Quad PF,6+ Mos 03/17/2017   Influenza,inj,quad, With Preservative 06/08/2015, 02/13/2016   Influenza-Unspecified 03/26/2012, 05/04/2013, 04/19/2014   PFIZER(Purple Top)SARS-COV-2 Vaccination 07/08/2019, 07/29/2019, 04/12/2020   Pneumococcal Conjugate-13 07/05/2014   Pneumococcal Polysaccharide-23 05/25/2009, 05/26/2011   Tdap 12/30/2011   Unspecified SARS-COV-2 Vaccination 05/21/2023   Zoster,  Live 05/23/2020    External Records Personally Reviewed: pulmonary, research  Assessment and Plan Assessment & Plan Idiopathic Pulmonary Fibrosis (IPF) with concern for progression No current treatment due to side effect concerns. Discussed medication benefits and side effects. Patient prefers watchful waiting but open to treatment discussion with Doctor Mannam - Order repeat pulmonary function tests - spiro/dlco - Discuss potential CT scan with Doctor Mannam if indicated. - Encourage discussion with Doctor Mannam regarding anti-fibrotic therapy  Weight Loss Weight loss noted post-cataract surgery, possible IPF progression. - Monitor weight and appetite changes. - Order repeat pulmonary function tests.  Chronic Sinusitis Chronic sinusitis with nasal drainage and congestion. Current management includes saline and cetirizine. Discussed Flonase  use for inflammation and mucus reduction. - Start Flonase  nasal spray twice daily. - Continue cetirizine daily. - Consider ipratropium nasal spray if symptoms persist after two to three weeks.    Return to Care: Return in about 4 weeks (around 12/14/2023) for next available PFT, follow up after. With Dr Waylan Haggard   Louie Rover, MD Pulmonary and Critical Care Medicine [facility] [phone]

## 2023-11-16 NOTE — Patient Instructions (Addendum)
 It was a pleasure to see you today!  Please schedule follow up with Dr Waylan Haggard in 1 months.  Before your next visit I would like you to have: PFT - spirometry and DLCO  VISIT SUMMARY:  During your visit, we discussed your idiopathic pulmonary fibrosis (IPF), weight loss, chronic sinusitis, and seasonal allergic rhinitis. We reviewed your current symptoms, including your worsening cough, nasal drainage, and gastrointestinal concerns. We also talked about your current medications and potential treatments.  YOUR PLAN:  -IDIOPATHIC PULMONARY FIBROSIS (IPF): Idiopathic Pulmonary Fibrosis is a lung disease that causes scarring of the lungs, making it difficult to breathe. You  have chosen not to start treatment due to concerns about side effects. We will order repeat pulmonary function tests to monitor your lung function and discuss the possibility of a CT scan with Dr. Waylan Haggard if needed. Revisit treatment with Ofev or Esbriet.  -WEIGHT LOSS: You have experienced weight loss since your cataract surgery, which may be related to your IPF. We will monitor your weight and appetite changes and order repeat pulmonary function tests to assess your condition.  -CHRONIC SINUSITIS: Chronic sinusitis is a long-term inflammation of the sinuses that causes nasal drainage and congestion. You are currently managing it with saline and cetirizine. We recommend starting Flonase  nasal spray twice daily to reduce inflammation and mucus. If your symptoms persist after two to three weeks, consider using ipratropium nasal spray.  INSTRUCTIONS:  Please follow up with Dr. Waylan Haggard to discuss your IPF treatment options and the potential need for a CT scan. Continue monitoring your weight and appetite, and use Flonase  nasal spray twice daily for your sinusitis and allergic rhinitis. If your sinus symptoms persist after two to three weeks, consider using ipratropium nasal spray.

## 2023-11-23 ENCOUNTER — Telehealth: Payer: Self-pay | Admitting: *Deleted

## 2023-11-23 NOTE — Telephone Encounter (Signed)
 Copied from CRM [id number]. Topic: Clinical - Medical Advice >> Nov 20, 2023 11:39 AM Tyronne Galloway wrote: Reason for CRM: Pt is prescribed fluticasone  (FLONASE ) 50 MCG/ACT nasal spray and was told by Dr. Dione Franks on 6/2 to take it twice per day, matching her visit notes. Pt stated his medication states to use it up to 2 sprays in each nostril 1 time a day. Please confirm and call pt back at [phone] ok to leave a vm. >> Nov 23, 2023  3:41 PM Ilene Malling wrote: Patient [phone] is returning Leslie's call and to discuss medication question. Unable to reached CAL. Please call back.  Spoke with patient regarding prior message . Patient stated his flonase  stated on the bottle 2 sprays once daily and on the after visit summary it says 2 spary's 2 times daily. Advised patient that Dr.Desai will be in the offic on Wednesday if patient can wait to get the correct information. Patient stated he can wait and do as the direction's say on the bottle .  Dr.Desai can you please advise    Thank you

## 2023-11-23 NOTE — Telephone Encounter (Signed)
 I called the pt and there was no answer- LMTCB   Per last AVS regarding nasal spray:  -CHRONIC SINUSITIS: Chronic sinusitis is a long-term inflammation of the sinuses that causes nasal drainage and congestion. You are currently managing it with saline and cetirizine. We recommend starting Flonase  nasal spray twice daily to reduce inflammation and mucus. If your symptoms persist after two to three weeks, consider using ipratropium nasal spray.

## 2023-11-23 NOTE — Telephone Encounter (Signed)
 Copied from CRM [id number]. Topic: Clinical - Medical Advice >> Nov 20, 2023 11:39 AM Javier Phillips wrote: Reason for CRM: Pt is prescribed fluticasone  (FLONASE ) 50 MCG/ACT nasal spray and was told by Dr. Dione Franks on 6/2 to take it twice per day, matching her visit notes. Pt stated his medication states to use it up to 2 sprays in each nostril 1 time a day. Please confirm and call pt back at [phone] ok to leave a vm.

## 2023-11-25 NOTE — Telephone Encounter (Signed)
 1 spray each side of the nose in the morning, 1 spray each side of the nose at night. If symptoms improved after a few weeks can reduce to 1 spray each side of the nose once a day.

## 2023-11-30 NOTE — Telephone Encounter (Signed)
 ATC X1. LMTCB

## 2023-12-14 ENCOUNTER — Ambulatory Visit: Payer: Self-pay

## 2023-12-14 ENCOUNTER — Encounter: Payer: Self-pay | Admitting: Internal Medicine

## 2023-12-14 ENCOUNTER — Ambulatory Visit: Admitting: Internal Medicine

## 2023-12-14 VITALS — BP 124/76 | HR 70 | Temp 97.9°F | Ht 65.0 in | Wt 122.0 lb

## 2023-12-14 DIAGNOSIS — J84112 Idiopathic pulmonary fibrosis: Secondary | ICD-10-CM

## 2023-12-14 DIAGNOSIS — J069 Acute upper respiratory infection, unspecified: Secondary | ICD-10-CM

## 2023-12-14 DIAGNOSIS — J300 Vasomotor rhinitis: Secondary | ICD-10-CM | POA: Diagnosis not present

## 2023-12-14 MED ORDER — HYDROCOD POLI-CHLORPHE POLI ER 10-8 MG/5ML PO SUER: 5.0000 mL | Freq: Two times a day (BID) | ORAL | 0 refills | Status: AC | PRN

## 2023-12-14 NOTE — Progress Notes (Signed)
 Javier Phillips    [id number]    [birth year]  Primary Care Physician:Ross, Carlin Redbird, MD Date of Appointment: 12/14/2023 Established Patient Visit  Chief complaint:   Chief Complaint  Patient presents with   Acute Visit    Dry Coughing and sinus issue. Worsened over the weekend      HPI: Javier Phillips is a 77 y.o. man with IPF, not on anti-fibrotic therapy, who follows with Dr. Theophilus. Saw me for acute visit earlier this month for worsening cough,  nausea, weight loss.   Interval Updates: Here yet again for acute visit for cough.   Tried flonase  without much improvement. Now with sore throat, nasal drainage.   Wife also has some symptoms. Had temperature of 99.  Also more short of breath.  Covid and flu test was negative on Saturday.  Appetite is poor.  Has lost more weight 1-2 lbs since I saw him.   Not taking anything over the counter yet.   I have reviewed the patient's family social and past medical history and updated as appropriate.   Past Medical History:  Diagnosis Date   Anemia    Arthritis    Enlarged prostate    Heart murmur    Diagnosis of MVP years ago   Interstitial cystitis    Interstitial lung disease (HCC)    Pneumonia     Past Surgical History:  Procedure Laterality Date   BRONCHIAL BIOPSY  08/11/2022   Procedure: BRONCHIAL BIOPSIES;  Surgeon: Shelah Lamar RAMAN, MD;  Location: [facility] ENDOSCOPY;  Service: Pulmonary;;   BRONCHIAL BRUSHINGS  08/11/2022   Procedure: BRONCHIAL BRUSHINGS;  Surgeon: Shelah Lamar RAMAN, MD;  Location: [facility] ENDOSCOPY;  Service: Pulmonary;;   BRONCHIAL NEEDLE ASPIRATION BIOPSY  08/11/2022   Procedure: BRONCHIAL NEEDLE ASPIRATION BIOPSIES;  Surgeon: Shelah Lamar RAMAN, MD;  Location: [facility] ENDOSCOPY;  Service: Pulmonary;;   BRONCHIAL WASHINGS  08/11/2022   Procedure: BRONCHIAL WASHINGS;  Surgeon: Shelah Lamar RAMAN, MD;  Location: [facility] ENDOSCOPY;  Service: Pulmonary;;   HERNIA REPAIR      Family History  Problem Relation  Age of Onset   Stroke Mother    Dementia Father    Dementia Brother     Social History   Occupational History   Not on file  Tobacco Use   Smoking status: Never   Smokeless tobacco: Never  Vaping Use   Vaping status: Never Used  Substance and Sexual Activity   Alcohol use: Not Currently    Comment: years ago   Drug use: Never   Sexual activity: Yes     Physical Exam: Blood pressure 124/76, pulse 70, temperature 97.9 F (36.6 C), temperature source Oral, height 5' 5 (1.651 m), weight 122 lb (55.3 kg), SpO2 95%.  Gen:      No acute distress, thin, elderly Lungs:   bibasilar crackles R>L CV:         Regular rate and rhythm; no murmurs, rubs, or gallops.  No pedal edema   Data Reviewed: Imaging: I have personally reviewed the CT Chest Nov 2024 which shows subpleural reticulation and traction bronchiectasis with craniocaudal gradient c/w IPF.   PFTs:     Latest Ref Rng & Units 07/08/2022   10:49 AM 10/03/2021    9:43 AM 11/27/2020    8:56 AM  PFT Results  FVC-Pre L 2.08  1.72  2.05   FVC-Predicted Pre % 61  50  59   FVC-Post L 2.16  2.13  2.14   FVC-Predicted Post % 63  62  61   Pre FEV1/FVC % % 89  85  92   Post FEV1/FCV % % 90  92  92   FEV1-Pre L 1.85  1.46  1.89   FEV1-Predicted Pre % 76  59  75   FEV1-Post L 1.94  1.96  1.97   DLCO uncorrected ml/min/mmHg 11.92   19.14   DLCO UNC% % 55   88   DLCO corrected ml/min/mmHg 11.92   19.14   DLCO COR %Predicted % 55   88   DLVA Predicted % 84   120   TLC L 3.50  3.61  3.86   TLC % Predicted % 57  59  63   RV % Predicted % 61  57  65    I have personally reviewed the patient's PFTs and moderate restriction to ventilation FVC 61% of predicted   Labs: ANA positive, RF positive Lab Results  Component Value Date   NA 137 08/11/2022   K 3.7 08/11/2022   CO2 30 08/11/2022   GLUCOSE 92 08/11/2022   BUN 14 08/11/2022   CREATININE 1.28 (H) 08/11/2022   CALCIUM 8.8 (L) 08/11/2022   GFRNONAA 58 (L) 08/11/2022    Lab Results  Component Value Date   WBC 7.6 08/11/2022   HGB 12.9 (L) 08/11/2022   HCT 38.8 (L) 08/11/2022   MCV 87.6 08/11/2022   PLT 207 08/11/2022    Immunization status: Immunization History  Administered Date(s) Administered   Fluad Quad(high Dose 65+) 04/15/2021   Influenza Split 05/26/2011   Influenza, High Dose Seasonal PF 04/21/2018, 04/15/2019   Influenza,inj,Quad PF,6+ Mos 03/17/2017   Influenza,inj,quad, With Preservative 06/08/2015, 02/13/2016   Influenza-Unspecified 03/26/2012, 05/04/2013, 04/19/2014   PFIZER(Purple Top)SARS-COV-2 Vaccination 07/08/2019, 07/29/2019, 04/12/2020   Pneumococcal Conjugate-13 07/05/2014   Pneumococcal Polysaccharide-23 05/25/2009, 05/26/2011   Tdap 12/30/2011   Unspecified SARS-COV-2 Vaccination 05/21/2023   Zoster, Live 05/23/2020    External Records Personally Reviewed: pulmonary, research  Assessment and Plan Assessment & Plan Idiopathic Pulmonary Fibrosis (IPF) with concern for progression given cough, weight loss - repeat pft ordered before follow up with Dr. Theophilus - I will also order ct chest repeat to be done next month after he is through his URI and before appt with Dr Theophilus - discuss anti-fibrotic therapy at that time.   Acute viral URI - recommend supportive care - prn tussionex - nasal saline lavage, flonase   Chronic Sinusitis leading to cough - continue flonase , ipratropium, cetirizine    Return to Care: With Dr Theophilus in August as scheduled   Verdon Gore, MD Pulmonary and Critical Care Medicine [facility] [phone]

## 2023-12-14 NOTE — Telephone Encounter (Signed)
 FYI Only or Action Required?: FYI only for provider.  Patient is followed in Pulmonology for Pulmonary Fibrosis, last seen on 11/16/2023 by Meade Verdon RAMAN, MD. Called Nurse Triage reporting No chief complaint on file.. Symptoms began several days ago. Interventions attempted: Prescription medications: Ipratropium, . Symptoms are: gradually worsening.  Triage Disposition: See HCP Within 4 Hours (Or PCP Triage)  Patient/caregiver understands and will follow disposition?: Yes   Copied from CRM [id number]. Topic: Clinical - Red Word Triage >> Dec 14, 2023  8:10 AM Javier Phillips wrote: Red Word that prompted transfer to Nurse Triage: Increased cough >> Dec 14, 2023  8:27 AM Javier Phillips wrote: Patient got disconnected getting patient transferred back to nurse triage  Reason for Disposition  [1] MILD difficulty breathing (e.g., minimal/no SOB at rest, SOB with walking, pulse <100) AND [2] still present when not coughing  Answer Assessment - Initial Assessment Questions 1. ONSET: When did the cough begin?      Several Days ago  2. SEVERITY: How bad is the cough today?      Intermittent  3. SPUTUM: Describe the color of your sputum (none, dry cough; clear, white, yellow, green)     None  4. HEMOPTYSIS: Are you coughing up any blood? If so ask: How much? (flecks, streaks, tablespoons, etc.)     None  5. DIFFICULTY BREATHING: Are you having difficulty breathing? If Yes, ask: How bad is it? (e.g., mild, moderate, severe)    - MILD: No SOB at rest, mild SOB with walking, speaks normally in sentences, can lie down, no retractions, pulse < 100.    - MODERATE: SOB at rest, SOB with minimal exertion and prefers to sit, cannot lie down flat, speaks in phrases, mild retractions, audible wheezing, pulse 100-120.    - SEVERE: Very SOB at rest, speaks in single words, struggling to breathe, sitting hunched forward, retractions, pulse > 120      Mild 90%  6. FEVER: Do you have a fever? If Yes,  ask: What is your temperature, how was it measured, and when did it start?     99.9, Last night, Today 98.8  7. CARDIAC HISTORY: Do you have any history of heart disease? (e.g., heart attack, congestive heart failure)      Mitral Valve Disorder  8. LUNG HISTORY: Do you have any history of lung disease?  (e.g., pulmonary embolus, asthma, emphysema)     Pulmonary Fibrosis  9. PE RISK FACTORS: Do you have a history of blood clots? (or: recent major surgery, recent prolonged travel, bedridden)     No  10. OTHER SYMPTOMS: Do you have any other symptoms? (e.g., runny nose, wheezing, chest pain)       Nasal Congestion, Sore Throat  12. TRAVEL: Have you traveled out of the country in the last month? (e.g., travel history, exposures)       Wife has similar symptoms  Protocols used: Cough - Acute Non-Productive-A-AH

## 2023-12-14 NOTE — Patient Instructions (Addendum)
 It was a pleasure to see you today!  Sorry to hear you have not been feeling well.  I suspect you have an upper respiratory infection since your wife has similar symptoms.  Continue the ipratropium nasal spray up to 4 times a day as needed for runny nose.  You can also use nasal saline in between.  I am sending some Tussionex nasal spray to help with the nasal drainage related to the illness.  Use this with caution as it can make you sleepy and groggy.  I do not recommend you use this and drive.  Over-the-counter you can try Delsym cough syrup.  Keep your appointment with Dr. Theophilus with the breathing testing beforehand.  I am also ordering a CT scan of your chest to be done in about a month so that he get that information to review before his appointment with you.  Still concerned about progression of your lung disease and he may need that information to help determine if you need treatment.

## 2023-12-17 DIAGNOSIS — R944 Abnormal results of kidney function studies: Secondary | ICD-10-CM | POA: Diagnosis not present

## 2023-12-17 DIAGNOSIS — D649 Anemia, unspecified: Secondary | ICD-10-CM | POA: Diagnosis not present

## 2023-12-23 ENCOUNTER — Ambulatory Visit (INDEPENDENT_AMBULATORY_CARE_PROVIDER_SITE_OTHER): Payer: Medicare PPO | Admitting: Otolaryngology

## 2023-12-24 DIAGNOSIS — Z6820 Body mass index (BMI) 20.0-20.9, adult: Secondary | ICD-10-CM | POA: Diagnosis not present

## 2023-12-24 DIAGNOSIS — Z0000 Encounter for general adult medical examination without abnormal findings: Secondary | ICD-10-CM | POA: Diagnosis not present

## 2023-12-24 DIAGNOSIS — Z23 Encounter for immunization: Secondary | ICD-10-CM | POA: Diagnosis not present

## 2023-12-31 ENCOUNTER — Ambulatory Visit (HOSPITAL_BASED_OUTPATIENT_CLINIC_OR_DEPARTMENT_OTHER)
Admission: RE | Admit: 2023-12-31 | Discharge: 2023-12-31 | Disposition: A | Discharge status: Home / Self Care | Source: Ambulatory Visit | Attending: Internal Medicine | Admitting: Internal Medicine

## 2023-12-31 DIAGNOSIS — J8410 Pulmonary fibrosis, unspecified: Secondary | ICD-10-CM | POA: Diagnosis not present

## 2023-12-31 DIAGNOSIS — R918 Other nonspecific abnormal finding of lung field: Secondary | ICD-10-CM | POA: Diagnosis not present

## 2023-12-31 DIAGNOSIS — J84112 Idiopathic pulmonary fibrosis: Secondary | ICD-10-CM | POA: Insufficient documentation

## 2024-01-04 ENCOUNTER — Ambulatory Visit: Payer: Self-pay | Admitting: Internal Medicine

## 2024-01-25 ENCOUNTER — Encounter: Payer: Self-pay | Admitting: Pulmonary Disease

## 2024-01-25 ENCOUNTER — Ambulatory Visit: Admitting: Internal Medicine

## 2024-01-25 ENCOUNTER — Ambulatory Visit: Admitting: Pulmonary Disease

## 2024-01-25 VITALS — BP 165/78 | HR 60 | Ht 65.0 in | Wt 119.8 lb

## 2024-01-25 DIAGNOSIS — J84112 Idiopathic pulmonary fibrosis: Secondary | ICD-10-CM | POA: Diagnosis not present

## 2024-01-25 DIAGNOSIS — J849 Interstitial pulmonary disease, unspecified: Secondary | ICD-10-CM

## 2024-01-25 LAB — PULMONARY FUNCTION TEST
DL/VA % pred: 102 %
DL/VA: 4.11 ml/min/mmHg/L
DLCO cor % pred: 69 %
DLCO cor: 14.63 ml/min/mmHg
DLCO unc % pred: 69 %
DLCO unc: 14.63 ml/min/mmHg
FEF 25-75 Pre: 3.37 L/s
FEF2575-%Pred-Pre: 201 %
FEV1-%Pred-Pre: 65 %
FEV1-Pre: 1.54 L
FEV1FVC-%Pred-Pre: 138 %
FEV6-%Pred-Pre: 49 %
FEV6-Pre: 1.54 L
FEV6FVC-%Pred-Pre: 107 %
FVC-%Pred-Pre: 46 %
FVC-Pre: 1.54 L
Pre FEV1/FVC ratio: 100 %
Pre FEV6/FVC Ratio: 100 %

## 2024-01-25 NOTE — Patient Instructions (Signed)
  VISIT SUMMARY: You came in today for a follow-up on your interstitial lung disease. We discussed your respiratory symptoms, recent weight loss, physical activity, and the surveillance of a benign lung nodule. Your home blood pressure readings are normal, but today's reading was elevated.  YOUR PLAN: -IDIOPATHIC PULMONARY FIBROSIS WITH PROGRESSIVE INTERSTITIAL LUNG DISEASE: Idiopathic pulmonary fibrosis is a lung disease that causes scarring of the lung tissue, leading to progressive difficulty in breathing. We will continue with your current management plan without starting new treatments due to potential side effects and your recent weight loss. Keep up with your regular exercise to maintain your quality of life. We will repeat your CT scan and lung function test in one year. Please contact us  if you notice any changes in your symptoms.  -BENIGN LUNG NODULE UNDER SURVEILLANCE: A benign lung nodule is a small, non-cancerous growth in the lung. Your recent CT scan showed no growth or concerning changes in the nodule. We will continue to monitor it and repeat the CT scan in one year.  -UNINTENTIONAL WEIGHT LOSS: You have experienced significant weight loss over the past several months, which may be related to gastrointestinal issues and your lung disease. Your weight has stabilized recently. To address the weight loss, increase your caloric intake with high-protein, high-calorie supplements like Ensure. Try to consume protein shakes with your meals to help increase your calorie intake.  INSTRUCTIONS: Please continue with your regular exercise routine and monitor your symptoms. We will repeat your CT scan and lung function test in one year. If you notice any changes in your symptoms or have any concerns, contact us  immediately.                      Contains text generated by Abridge.                                 Contains text generated by Abridge.

## 2024-01-25 NOTE — Progress Notes (Signed)
 Javier Phillips    [id number]    24-Jul-1945  Primary Care Physician:Ross, Carlin Redbird, MD  Referring Physician: Meade Verdon RAMAN, MD [address] [place],  [place] [address]  Chief complaint: Follow-up for interstitial lung disease, IPF  HPI: 78 y.o.  with history of mitral valve prolapse, GERD Complains of increasing dyspnea on exertion for the past 4 months.  This is associated with a dry hacking cough.  He is on albuterol  and cough drops which help.  Cough is exacerbated by exposure to cigarette smoke, freshly cut grass  Stays active with exercise at [facility], swimming but this has been limited somewhat recently Has followed with Dr. Lavona for mitral valve prolapse  Denies any joint pain, rash.  He has dry eyes.  Occasional dysphagia with choking on dry crackers.   Has seen Dr. Jeannetta for elevated ANA in 2022.  Findings are not felt to be secondary to connective tissue disease.  Additional serologic tests were ordered which are negative  He has also seen speech pathology in 2022 for modified barium swallow with findings of mild aspiration risk but no actual witnessed aspiration Completed pulmonary rehab in 2023  He underwent robotic navigational bronchoscopy on 08/11/2022 by Dr. Shelah for evaluation of left lung nodule.  The pathology was benign  Interim history:  Discussed the use of AI scribe software for clinical note transcription with the patient, who gave verbal consent to proceed.  History of Present Illness Javier Phillips is a 78 year old male with interstitial lung disease who presents for follow-up of his condition.  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, progressive over several years - Gradual improvement in respiratory symptoms following a severe upper respiratory infection in June - Recovery from infection has been slow but ongoing - Currently using an inhaler with symptomatic benefit - No current  report of chest pain, shortness of breath, or palpitations  Unintentional weight loss - Significant weight loss over the past several months - Weight prior to January: approximately 125 pounds; increased to 130 pounds before February - Weight loss following cataract surgery in February, associated with gastrointestinal symptoms - Additional 4-pound weight loss during the June respiratory infection - Weight has stabilized in the last few weeks - Actively increasing caloric intake and incorporating protein shakes to address weight loss  Physical activity and functional status - Engages in regular physical activity, including walking at the [facility] (approximately 1 mile at 3 mph) - Uses exercise bike and treadmill for balance and stability  Pulmonary nodule surveillance - Lung nodule previously biopsied and found to be benign - Nodule is under ongoing surveillance  Blood pressure monitoring - Home blood pressure readings are within normal range - Elevated blood pressure recorded during today's visit   Relevant pulmonary history 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  Wife uses a down pillow.   ILD questionnaire 11/27/20-negative except for above Smoking history: Never smoker Travel history: No significant travel history Relevant family history: Has family history of asthma and emphysema  Outpatient Encounter Medications as of 01/25/2024  Medication Sig   acetaminophen  (TYLENOL ) 325 MG tablet Take 650 mg by mouth every 6 (six) hours as needed. For fever   albuterol  (VENTOLIN  HFA) 108 (90 Base) MCG/ACT inhaler Inhale 1-2 puffs into the lungs every 4 (four) hours as  needed for wheezing or shortness of breath.   amitriptyline (ELAVIL) 10 MG tablet Take 1 tablet by mouth daily.   cetirizine (ZYRTEC) 10 MG tablet Take 10 mg by mouth daily.   chlorpheniramine-HYDROcodone  (TUSSIONEX) 10-8 MG/5ML Take 5 mLs by mouth every 12 (twelve) hours as needed for cough.   fluticasone  (FLONASE ) 50 MCG/ACT nasal spray SW AND USE 2 SPRAYS IN EACH NOSTRIL QD.   ipratropium (ATROVENT ) 0.03 % nasal spray Place 2 sprays into both nostrils 3 (three) times daily as needed for rhinitis.   oxybutynin (DITROPAN-XL) 10 MG 24 hr tablet Take 10 mg by mouth daily.   sodium chloride  (OCEAN) 0.65 % SOLN nasal spray Place 1 spray into both nostrils as needed for congestion.   Tamsulosin HCl (FLOMAX) 0.4 MG CAPS Take 0.4 mg by mouth in the morning and at bedtime.   methocarbamol (ROBAXIN) 750 MG tablet Take by mouth. (Patient not taking: Reported on 01/25/2024)   No facility-administered encounter medications on file as of 01/25/2024.   Vitals:   01/25/24 1318  BP: (!) 165/78  Pulse: 60  Height: 5' 5 (1.651 m)  Weight: 119 lb 12.8 oz (54.3 kg)  SpO2: 98%  BMI (Calculated): 19.94    Physical Exam GEN: No acute distress CV: Regular rate and rhythm, no murmurs LUNGS: Crackles heard on auscultation, normal respiratory effort SKIN JOINTS: Warm and dry, no rash   Data Reviewed: Imaging: Chest x-ray 09/10/2020- interstitial changes at the lung base suggestive of pulmonary fibrosis.   High-resolution CT 10/19/2020-basilar predominant fibrotic lung disease and probable UIP pattern  High-resolution CT 07/13/2021-stable pattern of pulmonary fibrosis in probable UIP pattern  High resolution CT 06/19/2022-slight progression in pulmonary fibrosis.  Development of solid 1.4 cm nodular focus of consolidation in the left lung.  PET/CT 07/04/2022-low-level uptake in consolidation of left lung Interstitial lung disease  High resolution CT 12/31/2023-pulmonary fibrosis and UIP pattern with slight progression compared to prior. I have reviewed the images personally  PFTs: 11/27/2020 FVC 2.14 (61%), FEV1 1.97 (79%), F/F 92, TLC 3.86 (63%), DLCO 19.14 (88%] Moderate restriction  10/03/2021 FVC 2.13  [62%], FEV1 1.96 [80%], F/F 92, TLC 3.61 [59%] Severe restriction  07/08/2022 FVC 2.16 [63%], FEV1 1.94 [80%], F/F 90, TLC 3.50 [57%], DLCO 11.92 [55%] Severe restriction, diffusion defect  01/25/2024 FVC 1.54 [46%], FEV1 1.54 [65%], F/F100, DLCO 14.63 [69%] Restrictive physiology, mild diffusion defect  Labs: Labs from primary care 09/07/2020 BNP -23.5 Comprehensive metabolic panel-normal with normal liver function CBC WBC 8.3, hemoglobin 13.2, platelets 152, 0% eos  CTD serologies 10/05/2020 ANA 1:640, nuclear speckled, positive myositis antibody, rheumatoid factor 22  Assessment & Plan 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 Slight worsening on recent CT scan compared to last year. Emphasis on quality of life and acceptance of disease progression. He is aware of the progressive nature of the disease and after multiple discussions has decided not to initiate antifibrotic therapy. Continues regular exercise to maintain quality of life.  - Continue current management without initiating new treatment for IPF per patient preference due to potential gastrointestinal side effects and current weight loss. -  Encourage regular exercise to maintain quality of life. - Repeat CT scan and lung function test in one year. - Advise to contact if there are any changes in symptoms.  Benign lung nodule under surveillance Lung nodule previously biopsied and found to be benign with no growth or concerning changes on recent CT scan. Radiologist noted it as an area of  concern, but no immediate action required. - Continue surveillance of lung nodule. - Repeat CT scan in one year.  Unintentional weight loss Weight loss of approximately 10 pounds over 6-8 months, potentially related to gastrointestinal issues and interstitial lung disease. Weight has stabilized recently. Emphasis on increasing caloric intake to address weight loss. - Encourage increased caloric intake with high-protein, high-calorie supplements like Ensure. - Advise to consume protein shakes with meals to increase caloric intake.  Plan/Recommendations: High-res CT and PFTs in 1 year  Lonna Coder MD [facility] 01/25/2024, 1:25 PM  CC: Meade Verdon RAMAN, MD

## 2024-01-25 NOTE — Progress Notes (Signed)
 Spirometry/diffusion capacity performed today.

## 2024-01-25 NOTE — Patient Instructions (Signed)
 Spirometry/diffusion capacity performed today.

## 2024-02-19 DIAGNOSIS — R3914 Feeling of incomplete bladder emptying: Secondary | ICD-10-CM | POA: Diagnosis not present

## 2024-02-19 DIAGNOSIS — N401 Enlarged prostate with lower urinary tract symptoms: Secondary | ICD-10-CM | POA: Diagnosis not present

## 2024-02-19 DIAGNOSIS — R3912 Poor urinary stream: Secondary | ICD-10-CM | POA: Diagnosis not present

## 2024-02-19 DIAGNOSIS — R3916 Straining to void: Secondary | ICD-10-CM | POA: Diagnosis not present

## 2024-03-15 DIAGNOSIS — H04123 Dry eye syndrome of bilateral lacrimal glands: Secondary | ICD-10-CM | POA: Diagnosis not present

## 2024-03-15 DIAGNOSIS — H2511 Age-related nuclear cataract, right eye: Secondary | ICD-10-CM | POA: Diagnosis not present

## 2024-03-24 DIAGNOSIS — L814 Other melanin hyperpigmentation: Secondary | ICD-10-CM | POA: Diagnosis not present

## 2024-03-24 DIAGNOSIS — Z85828 Personal history of other malignant neoplasm of skin: Secondary | ICD-10-CM | POA: Diagnosis not present

## 2024-03-24 DIAGNOSIS — L821 Other seborrheic keratosis: Secondary | ICD-10-CM | POA: Diagnosis not present

## 2024-03-24 DIAGNOSIS — L570 Actinic keratosis: Secondary | ICD-10-CM | POA: Diagnosis not present
